# Patient Record
Sex: Female | Born: 1935 | Race: White | Hispanic: No | Marital: Married | Smoking: Never smoker
Health system: Southern US, Community
[De-identification: ages and names within clinical notes are randomized; demographics above are authoritative.]

## PROBLEM LIST (undated history)

## (undated) DIAGNOSIS — I10 Essential (primary) hypertension: Secondary | ICD-10-CM

## (undated) DIAGNOSIS — Z9889 Other specified postprocedural states: Secondary | ICD-10-CM

## (undated) DIAGNOSIS — K219 Gastro-esophageal reflux disease without esophagitis: Secondary | ICD-10-CM

## (undated) DIAGNOSIS — E785 Hyperlipidemia, unspecified: Secondary | ICD-10-CM

## (undated) DIAGNOSIS — E669 Obesity, unspecified: Secondary | ICD-10-CM

## (undated) DIAGNOSIS — Z8719 Personal history of other diseases of the digestive system: Secondary | ICD-10-CM

## (undated) DIAGNOSIS — F419 Anxiety disorder, unspecified: Secondary | ICD-10-CM

## (undated) DIAGNOSIS — Z87442 Personal history of urinary calculi: Secondary | ICD-10-CM

## (undated) DIAGNOSIS — F32A Depression, unspecified: Secondary | ICD-10-CM

## (undated) DIAGNOSIS — F329 Major depressive disorder, single episode, unspecified: Secondary | ICD-10-CM

## (undated) HISTORY — PX: ABDOMINAL HYSTERECTOMY: SHX81

## (undated) HISTORY — DX: Other specified postprocedural states: Z98.890

## (undated) HISTORY — DX: Depression, unspecified: F32.A

## (undated) HISTORY — PX: KIDNEY STONE SURGERY: SHX686

## (undated) HISTORY — DX: Major depressive disorder, single episode, unspecified: F32.9

## (undated) HISTORY — DX: Obesity, unspecified: E66.9

## (undated) HISTORY — DX: Hyperlipidemia, unspecified: E78.5

## (undated) HISTORY — DX: Personal history of urinary calculi: Z87.442

## (undated) HISTORY — DX: Personal history of other diseases of the digestive system: Z87.19

## (undated) HISTORY — DX: Anxiety disorder, unspecified: F41.9

## (undated) HISTORY — DX: Essential (primary) hypertension: I10

## (undated) HISTORY — DX: Gastro-esophageal reflux disease without esophagitis: K21.9

---

## 1998-04-24 ENCOUNTER — Ambulatory Visit: Admission: RE | Admit: 1998-04-24 | Discharge: 1998-04-24 | Payer: Self-pay | Admitting: Internal Medicine

## 2003-04-05 ENCOUNTER — Encounter: Admission: RE | Admit: 2003-04-05 | Discharge: 2003-05-31 | Payer: Self-pay | Admitting: Internal Medicine

## 2004-05-20 ENCOUNTER — Ambulatory Visit: Payer: Self-pay

## 2004-06-21 ENCOUNTER — Ambulatory Visit: Payer: Self-pay | Admitting: Internal Medicine

## 2004-10-15 ENCOUNTER — Ambulatory Visit: Payer: Self-pay | Admitting: Internal Medicine

## 2004-12-19 ENCOUNTER — Ambulatory Visit: Payer: Self-pay | Admitting: Internal Medicine

## 2005-03-31 ENCOUNTER — Ambulatory Visit: Payer: Self-pay | Admitting: Internal Medicine

## 2006-02-26 ENCOUNTER — Ambulatory Visit: Payer: Self-pay | Admitting: Internal Medicine

## 2006-04-17 ENCOUNTER — Ambulatory Visit: Payer: Self-pay | Admitting: Internal Medicine

## 2006-04-17 ENCOUNTER — Inpatient Hospital Stay (HOSPITAL_COMMUNITY): Admission: EM | Admit: 2006-04-17 | Discharge: 2006-04-18 | Payer: Self-pay | Admitting: *Deleted

## 2006-04-18 ENCOUNTER — Ambulatory Visit: Payer: Self-pay | Admitting: Endocrinology

## 2006-04-20 ENCOUNTER — Ambulatory Visit (HOSPITAL_COMMUNITY): Admission: RE | Admit: 2006-04-20 | Discharge: 2006-04-20 | Payer: Self-pay | Admitting: Internal Medicine

## 2006-04-20 ENCOUNTER — Ambulatory Visit: Payer: Self-pay | Admitting: Internal Medicine

## 2006-04-22 ENCOUNTER — Ambulatory Visit: Payer: Self-pay

## 2006-05-05 ENCOUNTER — Ambulatory Visit: Payer: Self-pay | Admitting: Gastroenterology

## 2006-06-18 ENCOUNTER — Ambulatory Visit: Payer: Self-pay | Admitting: Internal Medicine

## 2006-10-01 ENCOUNTER — Ambulatory Visit: Payer: Self-pay | Admitting: Cardiovascular Disease

## 2006-10-22 ENCOUNTER — Ambulatory Visit: Payer: Self-pay

## 2006-10-22 ENCOUNTER — Ambulatory Visit: Payer: Self-pay | Admitting: Cardiology

## 2006-10-22 LAB — CONVERTED CEMR LAB
ALT: 19 units/L (ref 0–40)
Albumin: 3.4 g/dL — ABNORMAL LOW (ref 3.5–5.2)
Alkaline Phosphatase: 113 units/L (ref 39–117)
Bilirubin, Direct: 0.1 mg/dL (ref 0.0–0.3)
Total CHOL/HDL Ratio: 4.1
VLDL: 19 mg/dL (ref 0–40)

## 2006-10-29 ENCOUNTER — Ambulatory Visit: Payer: Self-pay | Admitting: Cardiovascular Disease

## 2007-03-06 ENCOUNTER — Encounter: Payer: Self-pay | Admitting: Internal Medicine

## 2007-03-06 DIAGNOSIS — I10 Essential (primary) hypertension: Secondary | ICD-10-CM

## 2007-03-06 DIAGNOSIS — E785 Hyperlipidemia, unspecified: Secondary | ICD-10-CM

## 2007-03-06 DIAGNOSIS — E669 Obesity, unspecified: Secondary | ICD-10-CM

## 2007-03-06 DIAGNOSIS — F329 Major depressive disorder, single episode, unspecified: Secondary | ICD-10-CM

## 2007-03-06 DIAGNOSIS — J309 Allergic rhinitis, unspecified: Secondary | ICD-10-CM | POA: Insufficient documentation

## 2007-03-06 DIAGNOSIS — F411 Generalized anxiety disorder: Secondary | ICD-10-CM | POA: Insufficient documentation

## 2007-03-06 DIAGNOSIS — Z87442 Personal history of urinary calculi: Secondary | ICD-10-CM

## 2007-03-06 DIAGNOSIS — K219 Gastro-esophageal reflux disease without esophagitis: Secondary | ICD-10-CM

## 2007-03-14 IMAGING — CR DG RIBS W/ CHEST 3+V*L*
4 series · 4 of 4 positions shown · non-contrast
Comparison: none

CLINICAL DATA: 70-year-old female with left rib pain.  Pain with breathing.
 LEFT RIBS WITH PA CHEST:

[w chest pa *]
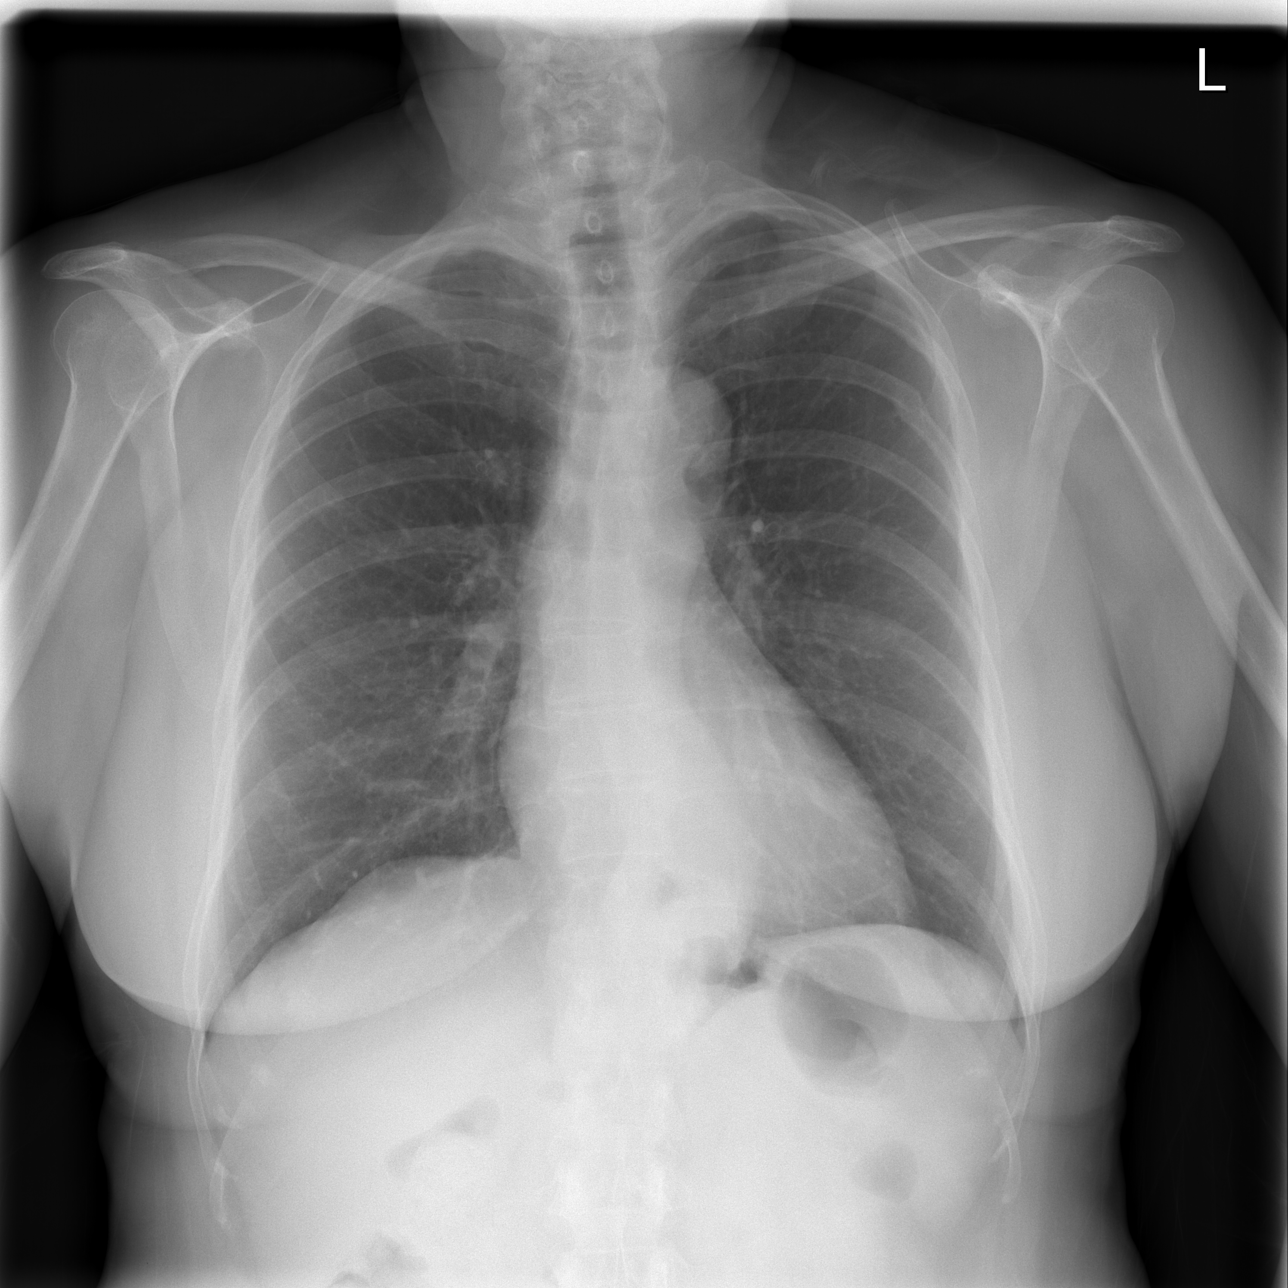

[w ribs ap/pa lower left *]
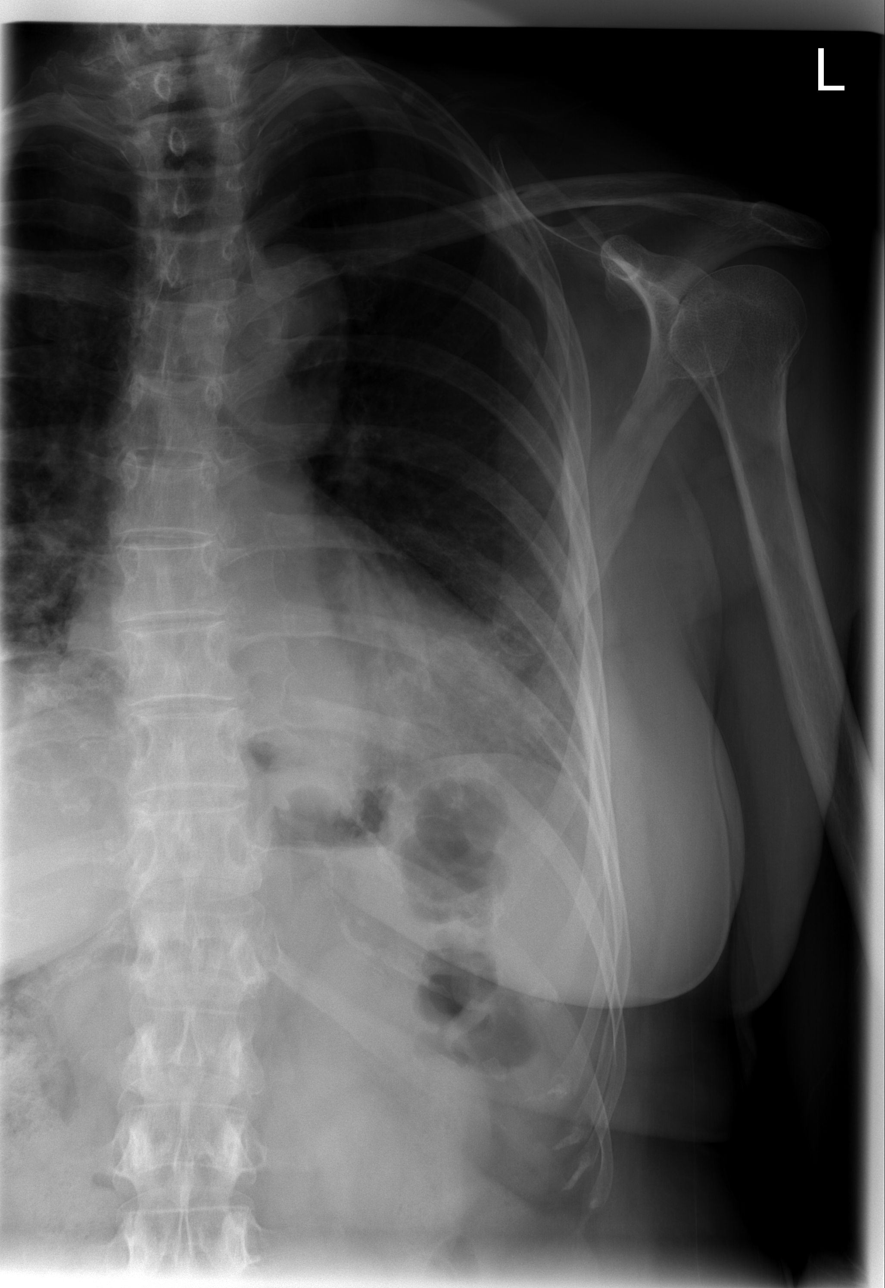

[w ribs oblique left * (1 of 2)]
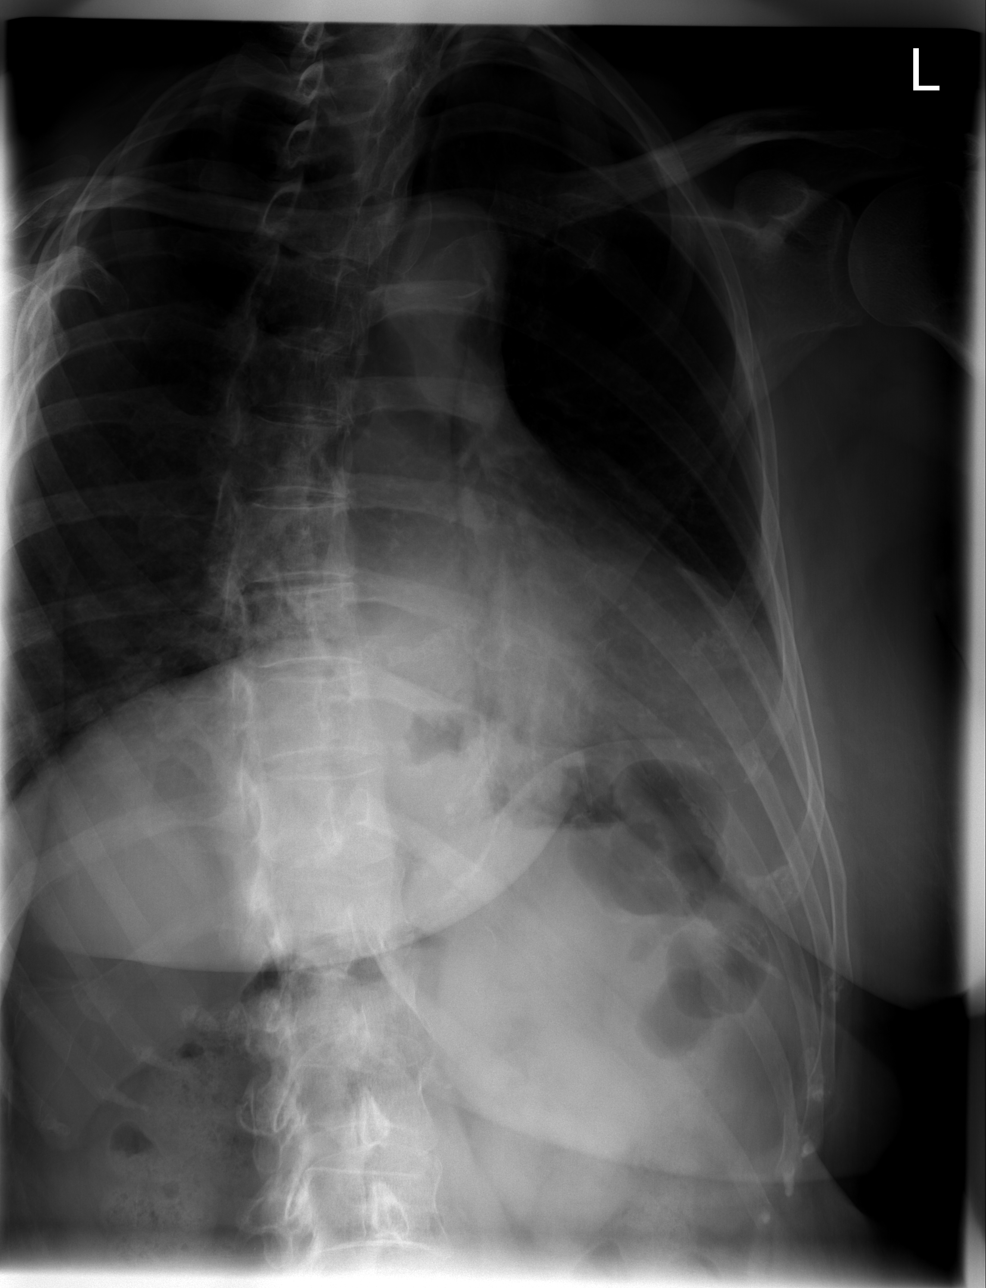

[w ribs oblique left * (2 of 2)]
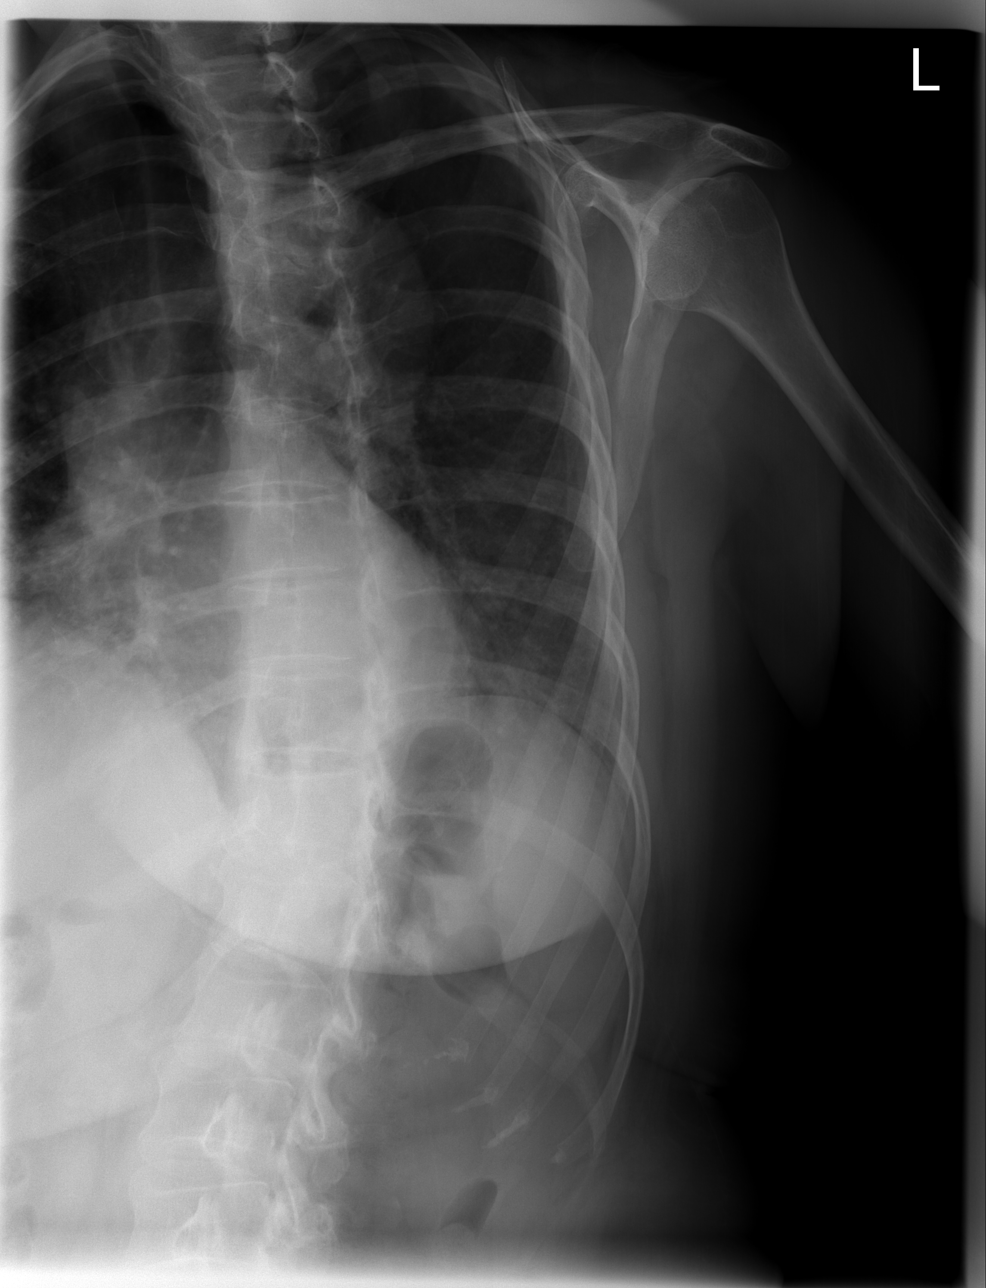

[4 of 4 positions shown; findings below may reference images not displayed]

FINDINGS: There is mild curvature of the thoracolumbar spine.  Heart size is normal.  Interstitial changes of the lungs are likely chronic in nature.  Unchanged from 04/17/06.
 There is improved aeration of the left base.  Remote fractures of the posterior left 4th and 5th ribs are redemonstrated.  No acute fractures are evident.
IMPRESSION: 1.  Remote fractures of left fourth and fifth posterior ribs.
 2.  No acute fracture.
 3.  Chronic interstitial lung disease.

## 2007-03-18 ENCOUNTER — Ambulatory Visit: Payer: Self-pay | Admitting: Internal Medicine

## 2007-03-18 LAB — CONVERTED CEMR LAB
Albumin: 3.3 g/dL — ABNORMAL LOW (ref 3.5–5.2)
Alkaline Phosphatase: 96 units/L (ref 39–117)
BUN: 19 mg/dL (ref 6–23)
Basophils Absolute: 0 10*3/uL (ref 0.0–0.1)
Bilirubin Urine: NEGATIVE
Cholesterol: 215 mg/dL (ref 0–200)
GFR calc Af Amer: 71 mL/min
GFR calc non Af Amer: 58 mL/min
HDL: 51.9 mg/dL (ref >39.0)
Hemoglobin, Urine: NEGATIVE
Hemoglobin: 12.8 g/dL (ref 12.0–15.0)
Leukocytes, UA: NEGATIVE
Lymphocytes Relative: 24.9 % (ref 12.0–46.0)
MCHC: 34.8 g/dL (ref 30.0–36.0)
Monocytes Absolute: 0.7 10*3/uL (ref 0.2–0.7)
Monocytes Relative: 11.6 % — ABNORMAL HIGH (ref 3.0–11.0)
Neutro Abs: 3.8 10*3/uL (ref 1.4–7.7)
Potassium: 4.7 meq/L (ref 3.5–5.1)
RDW: 11.9 % (ref 11.5–14.6)
TSH: 2.61 microintl units/mL (ref 0.35–5.50)
Total CHOL/HDL Ratio: 4.1
Triglycerides: 119 mg/dL (ref 0–149)
VLDL: 24 mg/dL (ref 0–40)
pH: 6 (ref 5.0–8.0)

## 2007-04-09 ENCOUNTER — Ambulatory Visit: Payer: Self-pay

## 2007-05-03 ENCOUNTER — Ambulatory Visit: Payer: Self-pay | Admitting: Cardiovascular Disease

## 2007-06-30 ENCOUNTER — Encounter: Payer: Self-pay | Admitting: Internal Medicine

## 2007-08-02 ENCOUNTER — Telehealth: Payer: Self-pay | Admitting: Internal Medicine

## 2007-10-28 ENCOUNTER — Ambulatory Visit: Payer: Self-pay | Admitting: Gastroenterology

## 2007-10-28 DIAGNOSIS — R1310 Dysphagia, unspecified: Secondary | ICD-10-CM | POA: Insufficient documentation

## 2007-12-02 ENCOUNTER — Ambulatory Visit: Payer: Self-pay | Admitting: Gastroenterology

## 2007-12-02 ENCOUNTER — Encounter: Payer: Self-pay | Admitting: Gastroenterology

## 2007-12-06 ENCOUNTER — Encounter: Payer: Self-pay | Admitting: Gastroenterology

## 2008-02-17 ENCOUNTER — Ambulatory Visit: Payer: Self-pay | Admitting: Cardiovascular Disease

## 2008-02-17 LAB — CONVERTED CEMR LAB
ALT: 17 units/L (ref 0–35)
AST: 26 units/L (ref 0–37)
Albumin: 3.4 g/dL — ABNORMAL LOW (ref 3.5–5.2)
Alkaline Phosphatase: 83 units/L (ref 39–117)
BUN: 17 mg/dL (ref 6–23)
Bilirubin, Direct: 0.1 mg/dL (ref 0.0–0.3)
CO2: 30 meq/L (ref 19–32)
Calcium: 9.2 mg/dL (ref 8.4–10.5)
Chloride: 100 meq/L (ref 96–112)
Cholesterol: 202 mg/dL (ref 0–200)
Creatinine, Ser: 0.9 mg/dL (ref 0.4–1.2)
Direct LDL: 98.3 mg/dL
GFR calc Af Amer: 80 mL/min
GFR calc non Af Amer: 66 mL/min
Glucose, Bld: 80 mg/dL (ref 70–99)
HDL: 53.2 mg/dL (ref >39.0)
Potassium: 4.2 meq/L (ref 3.5–5.1)
Sodium: 138 meq/L (ref 135–145)
Total Bilirubin: 0.9 mg/dL (ref 0.3–1.2)
Total CHOL/HDL Ratio: 3.8
Total Protein: 6.7 g/dL (ref 6.0–8.3)
Triglycerides: 73 mg/dL (ref 0–149)
VLDL: 15 mg/dL (ref 0–40)

## 2008-03-01 ENCOUNTER — Ambulatory Visit: Payer: Self-pay | Admitting: Internal Medicine

## 2008-04-18 ENCOUNTER — Encounter: Payer: Self-pay | Admitting: Internal Medicine

## 2008-10-24 ENCOUNTER — Ambulatory Visit: Payer: Self-pay

## 2008-10-24 ENCOUNTER — Encounter: Payer: Self-pay | Admitting: Cardiovascular Disease

## 2008-10-31 ENCOUNTER — Telehealth: Payer: Self-pay | Admitting: Cardiovascular Disease

## 2009-06-01 ENCOUNTER — Ambulatory Visit: Payer: Self-pay | Admitting: Cardiovascular Disease

## 2009-06-20 ENCOUNTER — Ambulatory Visit: Payer: Self-pay | Admitting: Internal Medicine

## 2009-06-20 DIAGNOSIS — R5383 Other fatigue: Secondary | ICD-10-CM

## 2009-06-20 DIAGNOSIS — R5381 Other malaise: Secondary | ICD-10-CM | POA: Insufficient documentation

## 2009-06-20 DIAGNOSIS — R05 Cough: Secondary | ICD-10-CM

## 2009-06-21 LAB — CONVERTED CEMR LAB
Basophils Relative: 0.5 % (ref 0.0–3.0)
Chloride: 100 meq/L (ref 96–112)
Creatinine, Ser: 0.8 mg/dL (ref 0.4–1.2)
Eosinophils Relative: 1.4 % (ref 0.0–5.0)
Folate: 6.6 ng/mL
GFR calc non Af Amer: 75.07 mL/min (ref >60)
MCV: 101.6 fL — ABNORMAL HIGH (ref 78.0–100.0)
Monocytes Absolute: 0.8 10*3/uL (ref 0.1–1.0)
Monocytes Relative: 11.9 % (ref 3.0–12.0)
Neutrophils Relative %: 58 % (ref 43.0–77.0)
Potassium: 4.1 meq/L (ref 3.5–5.1)
RBC: 3.92 M/uL (ref 3.87–5.11)
Vitamin B-12: 560 pg/mL (ref 211–911)
WBC: 6.9 10*3/uL (ref 4.5–10.5)

## 2009-07-18 ENCOUNTER — Ambulatory Visit: Payer: Self-pay | Admitting: Cardiovascular Disease

## 2009-07-23 LAB — CONVERTED CEMR LAB
ALT: 14 units/L (ref 0–35)
AST: 21 units/L (ref 0–37)
Albumin: 3.5 g/dL (ref 3.5–5.2)
Cholesterol: 199 mg/dL (ref 0–200)
HDL: 58.6 mg/dL (ref >39.00)
Total Protein: 6.6 g/dL (ref 6.0–8.3)
Triglycerides: 91 mg/dL (ref 0.0–149.0)
VLDL: 18.2 mg/dL (ref 0.0–40.0)

## 2009-08-31 ENCOUNTER — Encounter: Payer: Self-pay | Admitting: Cardiovascular Disease

## 2009-09-03 ENCOUNTER — Encounter (INDEPENDENT_AMBULATORY_CARE_PROVIDER_SITE_OTHER): Payer: Self-pay | Admitting: *Deleted

## 2009-09-17 ENCOUNTER — Telehealth: Payer: Self-pay | Admitting: Internal Medicine

## 2009-09-19 ENCOUNTER — Ambulatory Visit: Payer: Self-pay | Admitting: Internal Medicine

## 2009-10-04 ENCOUNTER — Telehealth: Payer: Self-pay | Admitting: Internal Medicine

## 2009-10-11 ENCOUNTER — Encounter: Payer: Self-pay | Admitting: Internal Medicine

## 2009-10-25 ENCOUNTER — Ambulatory Visit: Payer: Self-pay | Admitting: Internal Medicine

## 2009-10-25 DIAGNOSIS — N951 Menopausal and female climacteric states: Secondary | ICD-10-CM | POA: Insufficient documentation

## 2009-10-30 ENCOUNTER — Telehealth: Payer: Self-pay | Admitting: Internal Medicine

## 2009-12-13 ENCOUNTER — Encounter: Payer: Self-pay | Admitting: Internal Medicine

## 2010-06-11 ENCOUNTER — Ambulatory Visit: Payer: Self-pay | Admitting: Cardiovascular Disease

## 2010-06-11 ENCOUNTER — Encounter: Payer: Self-pay | Admitting: Cardiovascular Disease

## 2010-06-11 DIAGNOSIS — R0609 Other forms of dyspnea: Secondary | ICD-10-CM | POA: Insufficient documentation

## 2010-06-11 DIAGNOSIS — R0989 Other specified symptoms and signs involving the circulatory and respiratory systems: Secondary | ICD-10-CM

## 2010-07-11 ENCOUNTER — Other Ambulatory Visit: Payer: Self-pay | Admitting: Cardiovascular Disease

## 2010-07-11 ENCOUNTER — Ambulatory Visit (HOSPITAL_COMMUNITY): Admission: RE | Admit: 2010-07-11 | Payer: Self-pay | Source: Home / Self Care | Admitting: Cardiovascular Disease

## 2010-07-11 ENCOUNTER — Ambulatory Visit: Admission: RE | Admit: 2010-07-11 | Payer: Self-pay | Source: Home / Self Care

## 2010-07-11 ENCOUNTER — Ambulatory Visit
Admission: RE | Admit: 2010-07-11 | Discharge: 2010-07-11 | Payer: Self-pay | Source: Home / Self Care | Attending: Cardiovascular Disease | Admitting: Cardiovascular Disease

## 2010-07-11 LAB — BASIC METABOLIC PANEL
BUN: 18 mg/dL (ref 6–23)
CO2: 33 mEq/L — ABNORMAL HIGH (ref 19–32)
Calcium: 9.2 mg/dL (ref 8.4–10.5)
Chloride: 100 mEq/L (ref 96–112)
Creatinine, Ser: 0.7 mg/dL (ref 0.4–1.2)
GFR: 87.32 mL/min (ref >60.00)
Glucose, Bld: 87 mg/dL (ref 70–99)
Potassium: 4.1 mEq/L (ref 3.5–5.1)
Sodium: 138 mEq/L (ref 135–145)

## 2010-07-11 LAB — HIGH SENSITIVITY CRP: CRP, High Sensitivity: 6.63 mg/L — ABNORMAL HIGH (ref 0.00–5.00)

## 2010-07-11 LAB — HEPATIC FUNCTION PANEL
ALT: 13 U/L (ref 0–35)
AST: 18 U/L (ref 0–37)
Albumin: 3.3 g/dL — ABNORMAL LOW (ref 3.5–5.2)
Alkaline Phosphatase: 98 U/L (ref 39–117)
Bilirubin, Direct: 0.2 mg/dL (ref 0.0–0.3)
Total Bilirubin: 1 mg/dL (ref 0.3–1.2)
Total Protein: 6.2 g/dL (ref 6.0–8.3)

## 2010-07-11 LAB — LIPID PANEL
Cholesterol: 215 mg/dL — ABNORMAL HIGH (ref 0–200)
HDL: 58.7 mg/dL (ref >39.00)
Total CHOL/HDL Ratio: 4
Triglycerides: 130 mg/dL (ref 0.0–149.0)
VLDL: 26 mg/dL (ref 0.0–40.0)

## 2010-07-11 LAB — LDL CHOLESTEROL, DIRECT: Direct LDL: 149.1 mg/dL

## 2010-07-16 ENCOUNTER — Telehealth: Payer: Self-pay | Admitting: Cardiovascular Disease

## 2010-07-30 NOTE — Assessment & Plan Note (Signed)
Summary: BLOOD BLISTER IN THROAT PER DENTIST/  SWOLLEN VEINS / NWS   Vital Signs:  Patient profile:   75 year old female Height:      63 inches (160.02 cm) Weight:      151 pounds (68.64 kg) BMI:     26.85 O2 Sat:      96 % on Room air Temp:     97.3 degrees F (36.28 degrees C) oral Pulse rate:   70 / minute Pulse rhythm:   regular BP sitting:   134 / 84  (left arm) Cuff size:   regular  Vitals Entered By: Brenton Grills (October 25, 2009 1:58 PM)  O2 Flow:  Room air CC: pt c/o blood blister in throat per dentisit/pt also wants labwork to check vitamin d, cholesterol, pt also interested in getting zostavax, and discuss stretching esophagus and weight/aj   Primary Care Provider:  Oliver Barre  CC:  pt c/o blood blister in throat per dentisit/pt also wants labwork to check vitamin d, cholesterol, pt also interested in getting zostavax, and and discuss stretching esophagus and weight/aj.  History of Present Illness: Patient of Dr. Raphael Gibney who presents for several reasons: she would like to have ZostaVax; Wanted to disucss hormone replacement treatment. She has severe climacteric symptoms. She wants to discuss Vitamin D testing/replacement and bone health.  Current Medications (verified): 1)  Fluticasone Propionate 50 Mcg/act Susp (Fluticasone Propionate) .... Spray 2 Spray Into Both  Nostrils Once A Day 2)  Premarin 0.625 Mg  Tabs (Estrogens Conjugated) .... Take 1 Tablet By Mouth Once A Day On Hold 3)  Lisinopril-Hydrochlorothiazide 20-25 Mg Tabs (Lisinopril-Hydrochlorothiazide) .... Take 1 Tablet By Mouth Once A Day 4)  Aspirin 81 Mg Tbec (Aspirin) .... Take One Tablet By Mouth Daily 5)  Coq10 100 Mg Caps (Coenzyme Q10) .... Sometimes 6)  Protonix 40 Mg  Tbec (Pantoprazole Sodium) .Marland Kitchen.. 1 By Mouth Once Daily X Next 7 Days, Then Once Daily As Needed 7)  Tessalon Perles 100 Mg Caps (Benzonatate) .Marland Kitchen.. 1 By Mouth Three Times A Day As Needed For Cough 8)  Simvastatin 40 Mg Tabs (Simvastatin)  .... Take 1/2 Tablet Daily 9)  Flonase 50 Mcg/act Susp (Fluticasone Propionate) .... 2 Sprays Each Nostril Once Daily  Allergies (verified): 1)  ! Sulfa 2)  ! Penicillin  Past History:  Past Medical History: Last updated: January 14, 2009 Allergic rhinitis Anxiety Depression GERD Hyperlipidemia Hypertension Obesity H/O Renal Stones  Past Surgical History: Last updated: 01/14/09 Hysterectomy Removal of Kidney Stones- 1985  Family History: Last updated: 2009/01/14 Father: Died at age 19 heart attack Mother: Died at age 41 cerebral hemmorage  Social History: Last updated: 01-14-09 Married  3 children  Risk Factors: Smoking Status: never (03/06/2007)  Review of Systems  The patient denies anorexia, fever, weight loss, decreased hearing, chest pain, dyspnea on exertion, prolonged cough, abdominal pain, incontinence, and difficulty walking.    Physical Exam  General:  WNWD white female in no distress Head:  normocephalic and atraumatic.   Lungs:  normal respiratory effort.   Heart:  normal rate and regular rhythm.   Neurologic:  alert & oriented X3, cranial nerves II-XII intact, and gait normal.   Skin:  turgor normal and color normal.   Psych:  Oriented X3, normally interactive, good eye contact, and not anxious appearing.     Impression & Recommendations:  Problem # 1:  SYMPTOMATIC MENOPAUSAL/FEMALE CLIMACTERIC STATES (ICD-627.2) Discussed the symptoms of menopause. She is s/p hyterectomy at an early age but she  has noticed typical symptoms. We reviewed the risks and benefits of hormone replacement including the increased risk of breast cancer, the small potential risk for increased stroke and the lack of protection offorded by HRT in regard to heart disease. The benefits were reviewed including reducing the physical symptoms.  Plan - with a full understanding of the risks involved she wishes to start low dose estrogen replacement, therefore Rx provided for  premarin 0.3 mg once daily.  (at least 50% of 35 minute visit on education/counselling on this issue)  Her updated medication list for this problem includes:    Premarin 0.3 Mg Tabs (Estrogens conjugated) .Marland Kitchen... 1 by mouth once daily  Problem # 2:  Preventive Health Care (ICD-V70.0) She is concerned about vitamin D deficiency and bone health. 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 she does have a diet that includes calcium containing foods. We discussed the normal level of Vitamin D recommended as well as discussing the proven and yet to be proven benefits of Vitamin. D. She has never had a bone density study.   Plan - DXA scan - if osteopenic or worse will start routine treatment. For failure to respond will evaulate Vit D levels.(at least 40% of a 35 minute visit on education/counselling on this issue)  Immunizations: discussed shisngles and prevention. she is a candidate for Zostavax.   Complete Medication List: 1)  Fluticasone Propionate 50 Mcg/act Susp (Fluticasone propionate) .... Spray 2 spray into both  nostrils once a day 2)  Premarin 0.3 Mg Tabs (Estrogens conjugated) .Marland Kitchen.. 1 by mouth once daily 3)  Lisinopril-hydrochlorothiazide 20-25 Mg Tabs (Lisinopril-hydrochlorothiazide) .... Take 1 tablet by mouth once a day 4)  Aspirin 81 Mg Tbec (Aspirin) .... Take one tablet by mouth daily 5)  Coq10 100 Mg Caps (Coenzyme q10) .... Sometimes 6)  Protonix 40 Mg Tbec (Pantoprazole sodium) .Marland Kitchen.. 1 by mouth once daily x next 7 days, then once daily as needed 7)  Tessalon Perles 100 Mg Caps (Benzonatate) .Marland Kitchen.. 1 by mouth three times a day as needed for cough 8)  Simvastatin 40 Mg Tabs (Simvastatin) .... Take 1/2 tablet daily 9)  Flonase 50 Mcg/act Susp (Fluticasone propionate) .... 2 sprays each nostril once daily  Other Orders: Zoster (Shingles) Vaccine Live [id number]) Admin 1st Vaccine (29562) Prescriptions: PREMARIN 0.3 MG TABS (ESTROGENS CONJUGATED) 1 by mouth  once daily  #30 x 12   Entered and Authorized by:   Jacques Navy MD   Signed by:   Jacques Navy MD on 10/25/2009   Method used:   Print then Give to Patient   RxID:   [id number]    Immunizations Administered:  Zostavax # 1:    Vaccine Type: Zostavax    Site: left arm    Mfr: Merck    Dose: 0.5 ml    Route: [place]    Given by: Ami Bullins CMA    Exp. Date: 10/29/2010    Lot #: [id number]    VIS given: 04/11/05 given October 25, 2009.

## 2010-07-30 NOTE — Medication Information (Signed)
Summary: Premarin  Premarin   Imported By: Marylou Mccoy 12/05/2009 14:40:14  _____________________________________________________________________  External Attachment:    Type:   Image     Comment:   External Document

## 2010-07-30 NOTE — Progress Notes (Signed)
Summary: flonase  Phone Note Call from Patient Call back at [facility] Phone [phone]   Summary of Call: Patient called to see if she could switch back to flonase.  Initial call taken by: Lucious Groves,  October 04, 2009 4:22 PM  Follow-up for Phone Call        Patient is aware of prescription and is aware that she needs an appt. Due to fixed income patient must call back later for appt. Follow-up by: Lucious Groves,  October 04, 2009 4:37 PM    New/Updated Medications: FLONASE 50 MCG/ACT SUSP (FLUTICASONE PROPIONATE) 2 sprays each nostril once daily Prescriptions: FLONASE 50 MCG/ACT SUSP (FLUTICASONE PROPIONATE) 2 sprays each nostril once daily  #1 month x 3   Entered by:   Lucious Groves   Authorized by:   Jacques Navy MD   Signed by:   Lucious Groves on 10/04/2009   Method used:   Electronically to        [facility]* (retail)       [address]       [place], [place]  [address]       Ph: [phone]       Fax: [phone]   RxID:   [id number] FLONASE 50 MCG/ACT SUSP (FLUTICASONE PROPIONATE) 2 sprays each nostril once daily  #1 month x 3   Entered by:   Lucious Groves   Authorized by:   Jacques Navy MD   Signed by:   Lucious Groves on 10/04/2009   Method used:   Electronically to        [facility] [phone]* (retail)       [address] [place], [place]  [address]       Ph: [phone]       Fax: [phone]   RxID:   [id number]

## 2010-07-30 NOTE — Miscellaneous (Signed)
Summary: update med  Clinical Lists Changes  Medications: Added new medication of SIMVASTATIN 40 MG TABS (SIMVASTATIN) take 1/2 tablet daily

## 2010-07-30 NOTE — Progress Notes (Signed)
Summary: SWITCH PCP  Phone Note Call from Patient Call back at Home Phone [phone]   Summary of Call: Kellyann Pepitone WANTS TO SWITCH PCP FROM DR. JOHN TO DR. Debby Bud.  WILL THIS BE OK? Initial call taken by: Hilarie Fredrickson,  September 17, 2009 10:25 AM  Follow-up for Phone Call        OK Follow-up by: Jacques Navy MD,  September 17, 2009 12:59 PM  Additional Follow-up for Phone Call Additional follow up Details #1::        ok Additional Follow-up by: Corwin Levins MD,  September 17, 2009 1:07 PM    Additional Follow-up for Phone Call Additional follow up Details #2::    PT IS AWARE/  APPT WITH DR. Debby Bud MARCH 23. Follow-up by: Hilarie Fredrickson,  September 17, 2009 3:01 PM

## 2010-07-30 NOTE — Progress Notes (Signed)
Summary: Rash  Phone Note Call from Patient   Caller: pt  Summary of Call: Pt called and states she was doing yard work on sat, She questions if she got in to some poison oak. She states the only symptoms she has is itching eyes. No rash or break out. she wants to know what you advise for itching eyes. Initial call taken by: Ami Bullins CMA,  Oct 30, 2009 9:47 AM  Follow-up for Phone Call        1. moisturizing eye drop of choice 2. otc loratadine 10mg  once daily  Follow-up by: Jacques Navy MD,  Oct 30, 2009 12:01 PM  Additional Follow-up for Phone Call Additional follow up Details #1::        I spoke w/pt Tuesday pm Additional Follow-up by: Lamar Sprinkles, CMA,  Nov 01, 2009 8:03 AM

## 2010-07-30 NOTE — Letter (Signed)
Summary: [facility]  [facility]   Imported By: Lester Dimock 12/14/2009 08:16:56  _____________________________________________________________________  External Attachment:    Type:   Image     Comment:   External Document

## 2010-07-30 NOTE — Letter (Signed)
Summary: [facility]  [facility]   Imported By: Sherian Rein 12/26/2009 11:47:05  _____________________________________________________________________  External Attachment:    Type:   Image     Comment:   External Document

## 2010-08-01 NOTE — Progress Notes (Signed)
Summary: lab results  Phone Note Call from Patient Call back at Home Phone [phone]   Caller: Patient Reason for Call: Talk to Nurse, Talk to Doctor, Lab or Test Results Summary of Call: pt rtn call to get lab results Initial call taken by: Omer Jack,  July 16, 2010 9:25 AM  Follow-up for Phone Call        I spoke with the pt and made her aware of lab results. The pt said she cannot take STATINS and she will work on diet, exercise and weight loss.  The pt also said she stopped the Amlodipine because it did not improve her BP.  The pt went back to Lisinopril HCT.  Follow-up by: Julieta Gutting, RN, BSN,  July 16, 2010 6:20 PM    New/Updated Medications: LISINOPRIL-HYDROCHLOROTHIAZIDE 20-25 MG TABS (LISINOPRIL-HYDROCHLOROTHIAZIDE) take one tablet daily

## 2010-08-01 NOTE — Assessment & Plan Note (Signed)
Summary: yearly   Visit Type:  1 year follow up Referring Provider:  Sob Primary Provider:  Oliver Barre  CC:  Indigestion- Weight gain-Sob.  History of Present Illness: This is a 75 year old woman with hypertension and hyperlipidemia presenting today for followup evaluation. She complains of exertional dyspnea and edema. No chest pain with activity. She has not been exercising regularly. Reports compliance with her medications.  Current Medications (verified): 1)  Fluticasone Propionate 50 Mcg/act Susp (Fluticasone Propionate) .... Spray 2 Spray Into Both  Nostrils Once A Day 2)  Premarin 0.3 Mg Tabs (Estrogens Conjugated) .Marland Kitchen.. 1 By Mouth Once Daily 3)  Lisinopril-Hydrochlorothiazide 20-25 Mg Tabs (Lisinopril-Hydrochlorothiazide) .... Take 1 Tablet By Mouth Once A Day 4)  Aspirin 81 Mg Tbec (Aspirin) .... Take One Tablet By Mouth Daily 5)  Coq10 100 Mg Caps (Coenzyme Q10) .... Sometimes 6)  Protonix 40 Mg  Tbec (Pantoprazole Sodium) .Marland Kitchen.. 1 By Mouth Once Daily X Next 7 Days, Then Once Daily As Needed 7)  Simvastatin 40 Mg Tabs (Simvastatin) .... Take 1/2 Tablet Daily. On Hold Last Dose About 6 Months Ago.  Allergies: 1)  ! Sulfa 2)  ! Penicillin 3)  ! * Statints  Past History:  Past medical history reviewed for relevance to current acute and chronic problems.  Past Medical History: Reviewed history from 01/13/2009 and no changes required. Allergic rhinitis Anxiety Depression GERD Hyperlipidemia Hypertension Obesity H/O Renal Stones  Review of Systems       Negative except as per HPI   Vital Signs:  Patient profile:   75 year old female Height:      63 inches Weight:      153.25 pounds BMI:     27.25 O2 Sat:      97 % on Room air Pulse rate:   99 / minute Pulse rhythm:   regular Resp:     18 per minute BP sitting:   146 / 90  (left arm) Cuff size:   large  Vitals Entered By: Vikki Ports (June 11, 2010 2:28 PM)  O2 Flow:  Room air  Physical  Exam  General:  Pt is alert and oriented, in no acute distress. HEENT: normal Neck: normal carotid upstrokes without bruits, JVP normal Lungs: CTA CV: RRR with 2/6 systolic murmur RUSB Abd: soft, NT, positive BS, no bruit, no organomegaly Ext: no clubbing, cyanosis, or edema. peripheral pulses 2+ and equal Skin: warm and dry without rash    EKG  Procedure date:  06/11/2010  Findings:      NSR 99 bpm, nonspecific ST-T abnormality.  Impression & Recommendations:  Problem # 1:  HYPERTENSION (ICD-401.9) BP control suboptimal. Pt would like to discontinue lisinopril. Will try amlodipine and HCTZ. Discussed need for diet, exercise, and weight loss.  Will check stress echo in setting exertional dyspnea.  Her updated medication list for this problem includes:    Amlodipine Besylate 5 Mg Tabs (Amlodipine besylate) .Marland Kitchen... Take one tablet by mouth daily    Aspirin 81 Mg Tbec (Aspirin) .Marland Kitchen... Take one tablet by mouth daily    Hydrochlorothiazide 25 Mg Tabs (Hydrochlorothiazide) .Marland Kitchen... Take one tablet by mouth daily.  BP today: 146/90 Prior BP: 134/84 (10/25/2009)  Labs Reviewed: K+: 4.1 (06/20/2009) Creat: : 0.8 (06/20/2009)   Chol: 199 (07/18/2009)   HDL: 58.60 (07/18/2009)   LDL: 122 (07/18/2009)   TG: 91.0 (07/18/2009)  Problem # 2:  HYPERLIPIDEMIA (ICD-272.4) Recommend recheck of lipids as pt has been off of statin for several months now. Will  review results and decide on whether to resume statin Rx for primary prevention.  Her updated medication list for this problem includes:    Simvastatin 40 Mg Tabs (Simvastatin) .Marland Kitchen... Take 1/2 tablet daily. on hold last dose about 6 months ago.  CHOL: 199 (07/18/2009)   LDL: 122 (07/18/2009)   HDL: 58.60 (07/18/2009)   TG: 91.0 (07/18/2009) CRP: 7.00 mg/L (10/22/2006)     Her updated medication list for this problem includes:    Simvastatin 40 Mg Tabs (Simvastatin) .Marland Kitchen... Take 1/2 tablet daily. on hold last dose about 6 months ago.  Other  Orders: EKG w/ Interpretation (93000) Echocardiogram (Echo)  Patient Instructions: 1)  Your physician recommends that you schedule a follow-up appointment in: 12 months 2)  Your physician recommends that you return for a FASTING lipid profile,liver profile, bmp and crp(c-reactive protein)-401.1,272.0  on day of echocardiogram in January 3)  Your physician has recommended you make the following change in your medication: Stop lisinopril/hctz combination.  Start amlodipine 5mg  by mouth daily and hydrochlorothiazide 25 mg by mouth daily 4)  Your physician has requested that you have an echocardiogram.  Echocardiography is a painless test that uses sound waves to create images of your heart. It provides your doctor with information about the size and shape of your heart and how well your heart's chambers and valves are working.  This procedure takes approximately one hour. There are no restrictions for this procedure. To be done in January Prescriptions: HYDROCHLOROTHIAZIDE 25 MG TABS (HYDROCHLOROTHIAZIDE) Take one tablet by mouth daily.  #30 x 11   Entered by:   Dossie Arbour, RN, BSN   Authorized by:   Norva Karvonen, MD   Signed by:   Dossie Arbour, RN, BSN on 06/11/2010   Method used:   Electronically to        [facility] (retail)       [address]       [place], [place]  [address]       Ph: [phone]       Fax: [phone]   RxID:   [id number] AMLODIPINE BESYLATE 5 MG TABS (AMLODIPINE BESYLATE) Take one tablet by mouth daily  #30 x 11   Entered by:   Dossie Arbour, RN, BSN   Authorized by:   Norva Karvonen, MD   Signed by:   Dossie Arbour, RN, BSN on 06/11/2010   Method used:   Electronically to        [facility] (retail)       [address]       [place], [place]  [address]       Ph: [phone]       Fax: [phone]   RxID:   [id number]

## 2010-08-05 ENCOUNTER — Encounter (INDEPENDENT_AMBULATORY_CARE_PROVIDER_SITE_OTHER): Payer: Self-pay | Admitting: *Deleted

## 2010-08-05 ENCOUNTER — Ambulatory Visit (INDEPENDENT_AMBULATORY_CARE_PROVIDER_SITE_OTHER): Payer: MEDICARE | Admitting: Internal Medicine

## 2010-08-05 ENCOUNTER — Other Ambulatory Visit: Payer: MEDICARE

## 2010-08-05 ENCOUNTER — Other Ambulatory Visit: Payer: Self-pay | Admitting: Internal Medicine

## 2010-08-05 ENCOUNTER — Encounter: Payer: Self-pay | Admitting: Internal Medicine

## 2010-08-05 DIAGNOSIS — I10 Essential (primary) hypertension: Secondary | ICD-10-CM

## 2010-08-05 DIAGNOSIS — Z87442 Personal history of urinary calculi: Secondary | ICD-10-CM

## 2010-08-05 DIAGNOSIS — E785 Hyperlipidemia, unspecified: Secondary | ICD-10-CM

## 2010-08-05 DIAGNOSIS — J189 Pneumonia, unspecified organism: Secondary | ICD-10-CM

## 2010-08-05 LAB — URINALYSIS, ROUTINE W REFLEX MICROSCOPIC
Hgb urine dipstick: NEGATIVE
Nitrite: NEGATIVE
Specific Gravity, Urine: <=1.005 (ref 1.000–1.030)
Total Protein, Urine: NEGATIVE
Urine Glucose: NEGATIVE
Urobilinogen, UA: 1 (ref 0.0–1.0)

## 2010-08-05 LAB — CBC WITH DIFFERENTIAL/PLATELET
Basophils Relative: 0.6 % (ref 0.0–3.0)
Eosinophils Relative: 2.7 % (ref 0.0–5.0)
Lymphocytes Relative: 32.8 % (ref 12.0–46.0)
Neutrophils Relative %: 45.5 % (ref 43.0–77.0)
Platelets: 276 10*3/uL (ref 150.0–400.0)
RBC: 4.01 Mil/uL (ref 3.87–5.11)
WBC: 5.9 10*3/uL (ref 4.5–10.5)

## 2010-08-15 NOTE — Assessment & Plan Note (Signed)
Summary: ?pneumonia   Vital Signs:  Patient profile:   75 year old female Height:      63 inches Weight:      151 pounds BMI:     26.85 O2 Sat:      97 % on Room air Temp:     97.7 degrees F oral Pulse rate:   82 / minute BP sitting:   118 / 78  (left arm) Cuff size:   regular  Vitals Entered By: Bill Salinas CMA (August 05, 2010 1:19 PM)  O2 Flow:  Room air CC: pt here to follow up with md since being diagnosed with pneumonia at [facility] primary care  last wens/ ab   Primary Care Provider:  Oliver Barre  CC:  pt here to follow up with md since being diagnosed with pneumonia at [facility] primary care  last wens/ ab.  History of Present Illness: patient had a productive cough and felt weak and ill. She went to [facility] She did have a chest x-ray revealing a small LUL infiltrate. She was started cefdinir 300mg  two times a day and a codeine containing cough syrup.  She still has a congested cough, deep breath causes her to cough. She ws having low grade fevers. She has anorexia. She did not have blood drawn for lab work. She does report that she is constipated.   Current Medications (verified): 1)  Fluticasone Propionate 50 Mcg/act Susp (Fluticasone Propionate) .... Spray 2 Spray Into Both  Nostrils Once A Day 2)  Premarin 0.3 Mg Tabs (Estrogens Conjugated) .Marland Kitchen.. 1 By Mouth Once Daily 3)  Lisinopril-Hydrochlorothiazide 20-25 Mg Tabs (Lisinopril-Hydrochlorothiazide) .... Take One Tablet Daily 4)  Aspirin 81 Mg Tbec (Aspirin) .... Take One Tablet By Mouth Daily 5)  Coq10 100 Mg Caps (Coenzyme Q10) .... Sometimes 6)  Protonix 40 Mg  Tbec (Pantoprazole Sodium) .Marland Kitchen.. 1 By Mouth Once Daily X Next 7 Days, Then Once Daily As Needed 7)  Cefdinir 300 Mg Caps (Cefdinir) .Marland Kitchen.. 1 Capsule Two Times A Day  Allergies (verified): 1)  ! Sulfa 2)  ! Penicillin 3)  ! 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   Physical Exam  General:  alert, well-developed, well-nourished, and well-hydrated.   Head:  normocephalic and atraumatic.   Eyes:  pupils equal, pupils round, and corneas and lenses clear.   Ears:  no external deformities.   Nose:  no external deformity and no external erythema.   Neck:  supple and full ROM.   Lungs:  normal respiratory effort, normal breath sounds, no crackles, and no wheezes.   Heart:  normal rate and regular rhythm.   Abdomen:  soft.     Impression & Recommendations:  Problem # 1:  PNEUMONIA, LEFT UPPER LOBE (ICD-486) follow-up on what appears to be a mild infection based on history and small infiltrate on x-ray with both film and report reviewed.  Plan -complete antibiotics  lab - f/u [facility]          cough syrup of choice with guafenesin or take mucinex          no restirictions on activity.  Her updated medication list for this problem includes:    Cefdinir 300 Mg Caps (Cefdinir) .Marland Kitchen... 1 capsule two times a day  Orders: TLB-CBC Platelet - w/Differential (85025-CBCD)  Problem # 2:  RENAL CALCULUS, HX OF (ICD-V13.01) Patient reports dark urine  Plan - U/a r/o hematuria  Orders: TLB-Udip w/ Micro (81001-URINE)  Problem # 3:  HYPERLIPIDEMIA (ICD-272.4) eviewed last Lab from Jan 12th  - HDL 58, LDL 146. Patient is intolerant of statins  Plan - not at treatment threshold of 160 for patient w/o known caridac disease, or DM. Goal of 130 or  less. she will work on lifestyle mgt.   Problem # 4:  HYPERTENSION (ICD-401.9)  Her updated medication list for this problem includes:    Lisinopril-hydrochlorothiazide 20-25 Mg Tabs (Lisinopril-hydrochlorothiazide) .Marland Kitchen... Take one tablet daily  BP today: 118/78 Prior BP: 146/90 (06/11/2010)  Labs Reviewed: K+: 4.1 (07/11/2010) Creat: : 0.7 (07/11/2010)   Chol: 215 (07/11/2010)   HDL: 58.70 (07/11/2010)   LDL: 122 (07/18/2009)   TG: 130.0 (07/11/2010)  Good control on present meds.   Complete Medication List: 1)  Fluticasone Propionate 50 Mcg/act Susp (Fluticasone propionate) .... Spray 2 spray into both  nostrils once a day 2)  Premarin 0.3 Mg Tabs (Estrogens conjugated) .Marland Kitchen.. 1 by mouth once daily 3)  Lisinopril-hydrochlorothiazide 20-25 Mg Tabs (Lisinopril-hydrochlorothiazide) .... Take one tablet daily 4)  Aspirin 81 Mg Tbec (Aspirin) .... Take one tablet by mouth daily 5)  Coq10 100 Mg Caps (Coenzyme q10) .... Sometimes 6)  Protonix 40 Mg Tbec (Pantoprazole sodium) .Marland Kitchen.. 1 by mouth once daily x next 7 days, then once daily as needed 7)  Cefdinir 300 Mg Caps (Cefdinir) .Marland Kitchen.. 1 capsule two times a day   Orders Added: 1)  TLB-CBC Platelet - w/Differential [85025-CBCD] 2)  TLB-Udip w/ Micro [81001-URINE] 3)  Est. Patient Level IV [[address]]

## 2010-10-17 ENCOUNTER — Other Ambulatory Visit: Payer: Self-pay | Admitting: Cardiovascular Disease

## 2010-11-12 NOTE — Assessment & Plan Note (Signed)
[facility]                            CARDIOLOGY OFFICE NOTE   REKITA, MIOTKE                    MRN:          [id number]  DATE:05/03/2007                            DOB:          10-02-1935    Jessica Ware was seen in followup at the [facility] Cardiology office on  May 03, 2007.  Jessica Ware is a 75 year old woman with hypertension  and hyperlipidemia.  She continues to not feel very well overall.  She  does not have any specific complaints but generally feels tired.  She  has not been sleeping well at night.  She does not have chest pain,  dyspnea, edema, orthopnea, or PND.  She does feel like she is retaining  fluid but does not specifically describe edema in her legs.  She is  concerned about her lack of motivation to lose weight.  She has no other  specific complaints at this time.   CURRENT MEDICATION:  1. Aspirin 325 mg daily.  2. Lisinopril/HCT 20/12.5 mg daily.  3. Prevacid daily.  4. Zocor 20 mg q.h.s.  5. Premarin 0.625 mg daily.   EXAM:  She is alert and oriented and in no acute distress.  Weight is 151, blood pressure 138/80, heart rate 90, respiratory rate  16.  HEENT:  Normal.  NECK:  Normal carotid upstrokes without bruits.  Jugular venous pressure  was normal.  LUNGS:  Clear to auscultation bilaterally.  HEART:  Regular rate and rhythm without murmurs or gallops.  ABDOMEN:  Soft, nontender, no organomegaly, abdominal bruits.  EXTREMITIES:  No clubbing, cyanosis, or edema.  Peripheral pulses 2+ and  equal throughout.   EKG shows normal sinus rhythm and is within normal limits.   ASSESSMENT:  1. Hypertension.  While her blood pressure is just at the upper limits      of normal today, she tells me that at home her systolic blood      pressure has been running in the 140s, fairly consistently.  I have      asked her to increase the HCTZ component of her lisinopril HCT from      12.5 to 25 mg.  She will have a  BMET checked in 2 weeks.      Otherwise, we have made no further changes to her antihypertensive      regimen today.  2. Dyslipidemia.  Her lipids checked back in September are still      suboptimal.  Her LDL is 143 which is only a modest decrease from      her previous LDL which she was not on any medication.  Her HDL is      52, and total cholesterol is 215.  She requests to stay on      simvastatin due to cost issues, as she pays for her medication out-      of-pocket.  I have asked her to increase her Zocor from 20 to 40 mg      daily.   For followup, I would like to see Jessica Ware back in 6 months.  If she  has problems  in the interim, I would be happy to see her sooner.  She  should have lipids and LFTs checked in 3  months.  I tried to encourage her regarding the importance of a regular  exercise program, and the weight loss could significantly contribute to  improving her cholesterol panel.     [name]. Excell Seltzer, MD  Electronically Signed    MDC/MedQ  DD: 05/03/2007  DT: 05/04/2007  Job #: [id number]   cc:   Corwin Levins, MD

## 2010-11-12 NOTE — Assessment & Plan Note (Signed)
[facility]                            CARDIOLOGY OFFICE NOTE   Jessica Ware, Jessica Ware               MRN:          [id number]  DATE:03/01/2008                            DOB:          [birth year]    PRIMARY CARDIOLOGIST:  [name]. Excell Seltzer, MD   PRIMARY CARE PHYSICIAN:  Dr. Jonny Ruiz.   Jessica Ware is a 75 year old married white female patient who saw Dr.  Excell Seltzer in November 2008 for hypertension and hyperlipidemia.  Hydrochlorothiazide was increased for hypertension and she was started  on Zocor.  Zocor was increased for her hyperlipidemia.  She recently  missed her appointment with Dr. Excell Seltzer and wanted to see me rather than  wait for another appointment.  Her main concern is she does not like to  take statins because of all the information on the Internet that she has  read about them and wants to start on vitamin E and fish oil.  She also  has elevated blood pressure in the office today, but said it usually  runs 130/135 and is reluctant for me to increase her medications.  She  says she wants to try to exercise and lower her sodium for this.  She  recently had some muscle pains in her legs after much yard work,  gardening, and canning, and being on her feet a lot, but this ceased  with Advil.  She recently had labs and her cholesterol had come down  nicely on the simvastatin.  She denies any chest pain, palpitations,  dizziness, or presyncope.  She remains quite active, but does not  exercise as much she should.   CURRENT MEDICATIONS:  1. Premarin 0.625 mg daily.  2. Aspirin 325 mg daily.  3. Simvastatin 40 mg daily.  4. CoQ10 daily.  5. Lisinopril/hydrochlorothiazide 20/25 mg daily.  6. Protonix 40 mg daily.   PHYSICAL EXAMINATION:  GENERAL:  This is a pleasant young-looking 75-  year-old white female in no acute distress.  VITAL SIGNS:  Blood pressure 160/90, pulse 65, and weight 150.  NECK:  Without JVD, HJR, bruit, or thyroid  enlargement.  LUNGS:  Clear anterior, posterior, and lateral.  HEART:  Regular rate and rhythm at 65 beats per minute.  Normal S1 and  S2.  No murmur, rub, bruit, thrill, or heave noted.  ABDOMEN:  Soft without organomegaly, masses, lesions, or abnormal  tenderness.  EXTREMITIES:  Without cyanosis, clubbing, or edema.  She has good distal  pulses.   EKG normal sinus rhythm, normal EKG.   IMPRESSION:  1. Hypertension elevated today.  2. Dyslipidemia improved on increased simvastatin.   PLAN:  The patient is pretty adamant about coming off her simvastatin.  I agreed to let her do this and go ahead and try vitamin E and fish oil.  I also asked her to add soluble fiber daily to her diet.  I wanted to  increase her lisinopril to 40 mg and she refuses to do this.  I told her  she needs to cut back to 2 g sodium diet and increase her exercise to  lower blood pressure.  She will see Dr. Excell Seltzer back  in 6 months.  I have  asked her to see Dr. Jonny Ruiz as well.  She was asking for a prescription  for Premarin, I told her she needs to follow up with Dr. Jonny Ruiz for this.      Jacolyn Reedy, PA-C  Electronically Signed      Duke Salvia, MD, [facility]  Electronically Signed   ML/MedQ  DD: 03/01/2008  DT: 03/02/2008  Job #: [id number]

## 2010-11-15 NOTE — Assessment & Plan Note (Signed)
[facility]                                 ON-CALL NOTE   KRISTEENA, MEINEKE               MRN:          [id number]  DATE:01/20/2007                            DOB:          1936-02-20    TIME:  9:09 p.m.   PHONE NUMBER:  [phone].   CHIEF COMPLAINT:  Numbness in both hands.  Actually, when I spoke to her  on the phone, it was numbness in one hand.  The patient states she had  been peeling zucchinis today.  She had experienced some numbness in her  right hand, it does not feel like tingling, more like decreased  sensation.  She has no loss of grip or strength at all.  She thinks the  tip of one of her fingers on her left hand is a tiny bit numb, too, but  not nearly as much as the right.  She has had some problems ongoing with  left shoulder pain when she sleeps at night.  Otherwise, denies chest  pain or pain anywhere else.  She does not have any difficulty speaking,  no headaches, no weakness or other neurologic deficits.  I told her at  the current time I could not completely reassure her that this was not  the beginning of a stroke, but that we need to watch it very carefully.  It is possible that she has some carpal tunnel or nerve irritation from  her activities today, but I could not reassure her 100%.  She has taken  an aspirin already.  I told her to keep a close eye on her symptoms  tonight as the numbness begins to get worse, or proceed up her arm, or  she develops any weakness whatsoever.  Any difficulties of speech or new  symptoms, she is to go the emergency room for evaluation right away.  Otherwise, she is going to call Dr. Raphael Gibney office for a followup in the  morning.     Marne A. Tower, MD  Electronically Signed    MAT/MedQ  DD: 01/20/2007  DT: 01/21/2007  Job #: [id number]

## 2010-11-15 NOTE — Assessment & Plan Note (Signed)
[facility]                           GASTROENTEROLOGY OFFICE NOTE   LINDAANN, GRADILLA                    MRN:          [id number]  DATE:05/05/2006                            DOB:          July 14, 1935    PROBLEM:  Dysphagia.   Ms. Jessica Ware is a pleasant 75 year old white female referred through the  courtesy of Dr. Jonny Ruiz for evaluation.  She has been complaining of  progressive dysphagia to solids.  She has rare pyrosis.  She does complain  of hoarseness.  She was recently hospitalized briefly for pain under the  left breast.  It was felt to be due to musculoskeletal pain.  She denies  odynophagia.  She is on no gastric irritants, including nonsteroidals.   PAST MEDICAL HISTORY:  Pertinent for hypertension.  She has a history of  kidney stones.  She is status post partial hysterectomy.   FAMILY HISTORY:  Pertinent for both parents with heart disease.   MEDICATIONS:  Premarin, aspirin, lisinopril.   She is allergic to SULFA.   She does not smoke or drink.  She is married.   REVIEW OF SYSTEMS:  Positive for frequent cough, joint pains, and sleeping  problems.   PHYSICAL EXAMINATION:  VITAL SIGNS:  Pulse 60, blood pressure 120/80, weight  149.  HEENT: EOMI.  PERRLA. Sclerae are anicteric.  Conjunctivae are pink.  NECK:  Supple without thyromegaly, adenopathy or carotid bruits.  CHEST:  Clear to auscultation and percussion without adventitious sounds.  CARDIAC:  Regular rhythm; normal S1 S2.  There are no murmurs, gallops or  rubs.  ABDOMEN:  Bowel sounds are normoactive.  Abdomen is soft, non-tender and non-  distended.  There are no abdominal masses, tenderness, splenic enlargement  or hepatomegaly.  EXTREMITIES:  Full. Range of motion.  No cyanosis, clubbing or edema.  RECTAL:  Deferred.   IMPRESSION:  1. Dysphagia, likely secondary to peptic esophageal stricture.  2. Hoarseness.  This is probably related to gastroesophageal reflux     disease.   RECOMMENDATION:  1. Prevacid 30 mg daily.  2. Upper endoscopy with balloon dilatation as indicated.     Barbette Hair. Arlyce Dice, MD,FACG  Electronically Signed    RDK/MedQ  DD: 05/05/2006  DT: 05/06/2006  Job #: [id number]

## 2010-11-15 NOTE — Consult Note (Signed)
NAMEDAMANI, RANDO             ACCOUNT NO.:  [id number]   MEDICAL RECORD NO.:  [id number]          PATIENT TYPE:  INP   LOCATION:  3732                         FACILITY:  [facility]   PHYSICIAN:  Bevelyn Buckles. Bensimhon, MDDATE OF BIRTH:  Jan 29, 1936   DATE OF CONSULTATION:  04/17/2006  DATE OF DISCHARGE:  04/18/2006                                   CONSULTATION   [facility] CONSULT:   REQUESTING PHYSICIAN:  Dr. Thomos Lemons.   REASON FOR CONSULTATION:  Chest pain.   HISTORY OF PRESENT ILLNESS:  Ms. Larue is a very pleasant 75 year old  woman with a history of hypertension, hyperlipidemia and a strong family  history of coronary artery disease.  She denies any known history of  personal coronary artery disease.  She states that this morning at 9:30,  while she was on the telephone, she developed a sharp left-sided chest pain  under her breast.  This waxed and waned throughout the morning.  There is no  associated nausea, vomiting or shortness of breath.  She finally went to Dr.  Artist Pais who obtained an EKG which was normal.  However, given the nature of her  pain, she was admitted for observation.  She states that at baseline she  walks on the treadmill for 1 mile and 30 minutes several times a week  without difficulty.  She denies any recent fevers or chills.  The pain is  not positional but does get worse with deep breathing or turning.   REVIEW OF SYSTEMS:  Her review of systems is notable for occasional  palpitations as well as dysphasia.  She also has reflux symptoms at times.  She denies any bright red blood per rectum, no melena.  Remainder of her  review of systems is negative except for HPI and problem list.   PAST MEDICAL HISTORY:  1. Hypertension.  2. Hypercholesterolemia.  3. Gastroesophageal reflux disease with hiatal hernia.  4. Allergic rhinitis.  5. History of kidney stones.  6. History of retinal cyst.   MEDICATIONS:  Lisinopril/hydrochlorothiazide 20/12.5  daily, aspirin 325,  Premarin 0.625 mg a day and Protonix 40 a day.   ALLERGIES:  SULFA.   SOCIAL HISTORY:  She lives in [place] with her husband, she is retired,  no tobacco, no alcohol.  Mother died at 60 due to MI.  Father died at 52 due  to an MI.  She has one brother who had an MI in his 26s.   PHYSICAL EXAMINATION:  GENERAL:  She is well appearing, no acute distress.  Respirations are unlabored.  VITAL SIGNS:  Blood pressure is 160/106, heart rate 77.  She is saturating  94% on room air.  She is afebrile.  HEENT:  Sclerae are anicteric.  EOMI.  There is no xanthelasma.  Mucous  membranes are moist.  Neck is supple.  No JVD.  Carotids are 2+ bilaterally  without any bruits.  There is no lymphadenopathy or thyromegaly.  CARDIAC:  She has a regular rate and rhythm with no murmurs, rubs or  gallops.  She is tender on her chest wall under her left breast.  ABDOMEN:  Soft, nontender, nondistended, no hepatosplenomegaly, no bruits,  no masses, good bowel sounds.  There is no right upper quadrant tenderness.  RESPIRATORY:  Lungs are clear.  EXTREMITIES:  Warm with no cyanosis, clubbing or edema.  Distal pulses are  strong.  NEURO:  She is alert, oriented x3.  Cranial nerves II-XII are intact.  She  moves all four extremities without difficulty.  There are no rashes or  arthropathies.   STUDIES:  Chest x-ray is pending.  EKG from the office shows sinus rhythm at  a rate of 88 with no ST-T wave changes.  Labs are pending.   ASSESSMENT AND PLAN:  1. Atypical chest pain.  I doubt this is cardiac, I think it is probably      musculoskeletal.  However, given her risk factors would rule out      myocardial infarction.  If cardiac enzymes are negative, she can be      discharged home in the morning with a treadmill Myoview as an      outpatient.  I would consider a gastrointestinal workup for her      dysphagia.  I do not think she needs heparin at this point unless her      enzymes  are positive.  2. Hypertension.  This poorly controlled.  We will leave management to the      primary team.      Bevelyn Buckles. Bensimhon, MD  Electronically Signed     DRB/MEDQ  D:  04/17/2006  T:  04/19/2006  Job:  [id number]

## 2010-11-15 NOTE — H&P (Signed)
NAMEBRADLEIGH, SONNEN             ACCOUNT NO.:  [id number]   MEDICAL RECORD NO.:  [id number]          PATIENT TYPE:  INP   LOCATION:  3732                         FACILITY:  [facility]   PHYSICIAN:  Barbette Hair. Artist Pais, DO      DATE OF BIRTH:  [birth year]   DATE OF ADMISSION:  04/17/2006  DATE OF DISCHARGE:                                HISTORY & PHYSICAL   HISTORY OF PRESENT ILLNESS:  The patient is a 75 year old white female with  a history of poorly controlled hypertension and elevated cholesterol who  presents with acute left sided chest pain.  The patient states that at 9:30  this morning, the patient was on the phone when she suddenly experienced  severe left sided chest pain underneath her left breast.  It was not  exertional, but the pain did radiate to the center of her chest and she  initially rates the severity as 10 out of 10.  Currently, the pain is an 8  out of 10 and the patient notes she has increased pain with taking a deep  breath/inhalation.   She notes she has been seen by her primary care physician in the past with  some concerns of atypical chest pain.  The patient was scheduled to have a  stress test in the past.  She missed two of her previous appointments due to  family events.  She does have a history of dysphagia and Dr. Jonny Ruiz had  recommended a GI referral.  Review of chart notes that she has not followed  up with GI for EGD.   PAST MEDICAL HISTORY SUMMARY:  1. Hypertension poorly controlled.  2. History of hormone replacement therapy.  3. History of allergies.  4. History of GERD.  5. Status post hysterectomy.   CURRENT MEDICATIONS:  1. Premarin 0.625 mg one a day.  2. Aspirin 325 mg p.r.n.  3. Lisinopril/hydrochlorothiazide 20/12.5 one a day.  4. Prevacid 30 mg once a day.   ALLERGIES:  SULFA AND YEAST PRODUCTS.   FAMILY HISTORY:  Mother died at age 43, had a history of stroke and  hypertension.  Father died at age 47, had a history of MI, hypertension,  and  prostate cancer.   HABITS:  Denies any alcohol or tobacco, lives with her husband.   The patient's daughter is noted to have panic disorder.   PAST MEDICAL HISTORY SUMMARY:  1. Hypertension.  2. Hyperlipidemia.  3. Allergies.  4. Anxiety and depression.  5. GERD.  6. Remote history of nephrolithiasis.  7. History of macular edema of the right eye.   REVIEW OF SYSTEMS:  No fevers or chills.  Cardiovascular/respiratory  complaints as noted above.  The patient is also noted to have intermittent  dysphagia to solids, especially bread and pieces of chicken.  All other  systems negative.   PHYSICAL EXAMINATION:  VITAL SIGNS:  Weight 150 pounds, temperature 98, pulse 72, and blood  pressure 176/95 in the left arm in the seated position.  GENERAL:  The patient is a pleasant and somewhat anxious 75 year old white  female in no apparent distress.  HEENT:  Normocephalic, atraumatic, pupils were equal and reactive to light  bilaterally.  Extraocular movements intact.  The patient was anicteric.  Conjunctivae within normal limits.  External auditory canals and tympanic  membranes were clear bilaterally.  NECK:  Supple, no adenopathy, carotid bruit, or thyromegaly.  CHEST:  Exam normal respiratory effort, chest is clear to auscultation  bilaterally, the patient had no palpable chest tenderness underneath her  left breast or sternum.  CARDIOVASCULAR:  Regular rate and rhythm, no significant murmurs, gallops,  and rubs appreciated.  ABDOMEN:  Soft, nontender, positive bowel sounds, no organomegaly.  MUSCULOSKELETAL:  No cyanosis, clubbing, and edema.  SKIN:  Warm and dry.  NEUROLOGICAL:  Cranial nerves 2-12 grossly intact.  She was nonfocal.   EKG was performed in the office which showed normal sinus rhythm at 76 beats  per minute, the patient has nonspecific ST changes.   IMPRESSION:  1. Atypical chest pain in a 75 year old white female with multiple risk      factors.  2.  Hypertension uncontrolled.  3. History of hyperlipidemia, not currently on a statin.  4. History of anxiety/depression.  5. Hormone replacement therapy.  6. Chronic GERD with dysphagia.   RECOMMENDATIONS:  The patient will be admitted for observation.  The patient  has multiple risk factors for coronary artery disease.  In addition, the  patient is on hormone replacement and at a higher risk for DVT.  We will  obtain a D-dimer, if positive, a CT of the chest with PE protocol will be  ordered.  The patient will be started on aspirin, continued on her home anti-  hypertensives, and we will consult cardiology for evaluation.  The patient  may need inpatient cardiac stress test.      Barbette Hair. Artist Pais, DO  Electronically Signed    RDY/MEDQ  D:  04/17/2006  T:  04/17/2006  Job:  [id number]   cc:   Corwin Levins, MD

## 2010-11-15 NOTE — Discharge Summary (Signed)
NAME:  Jessica Ware, Jessica Ware             ACCOUNT NO.:  [id number]   MEDICAL RECORD NO.:  [id number]          PATIENT TYPE:  INP   LOCATION:  3732                         FACILITY:  [facility]   PHYSICIAN:  Sean A. Everardo All, MD    DATE OF BIRTH:  1935/07/07   DATE OF ADMISSION:  04/17/2006  DATE OF DISCHARGE:  04/18/2006                                 DISCHARGE SUMMARY   REASON FOR ADMISSION:  Chest pain.   HISTORY OF PRESENT ILLNESS:  A 75 year old woman admitted by Dr. Artist Pais on  April 17, 2006 for chest pain.  Please refer to his dictated history and  physical for details.   HOSPITAL COURSE:  The patient was admitted and cardiac enzymes were  negative.  She was seen in consultation by cardiology, who recommended  outpatient Cardiolite.  She was given symptomatic therapy.  D-Dimer was  elevated, but a CT scan was negative for pulmonary emboli.   By April 18, 2006, she was alert, oriented, feeling better, and eating her  usual diet.  She was thus discharged home in good condition.   She complained of malodorous cloudy urine during hospitalization and a  urinalysis and cultures are obtained prior to her discharge.  She is also  prescribed Cipro.   DISCHARGE DIAGNOSES:  1. Chest pain, likely musculoskeletal.  2. Urinary tract infection.  3. Otherwise same as prior to admission.   MEDICATIONS:  1. Isosorbide mononitrate 20 mg daily.  2. Darvocet-N 100 1 or 2 every 6 hours as needed for pain #50 with 1      future refill allowed.  3. Cipro 500 mg twice a day for a week.  4. Aspirin 325 mg a day.  5. Otherwise the same as on Dr. Olegario Messier history and physical.   DIET AND ACTIVITY:  No restriction.   FOLLOWUP:  1. Treadmill Cardiolite April 22, 2006 at 7:45 a.m.  2. See Dr. Jonny Ruiz several days to a week after that for follow up.           ______________________________  Cleophas Dunker. Everardo All, MD     SAE/MEDQ  D:  04/18/2006  T:  04/19/2006  Job:  [id number]   cc:   Corwin Levins, MD

## 2010-11-15 NOTE — Assessment & Plan Note (Signed)
[facility]                            CARDIOLOGY OFFICE NOTE   HANAAN, GANCARZ                    MRN:          [id number]  DATE:10/01/2006                            DOB:          01/06/1936    Jessica Ware presents to the [facility] as an  outpatient on October 01, 2006. She is a delightful 75 year old woman who  had an episode of chest pain back in October of 2007. She was admitted  overnight and ruled out for a myocardial infarction at that time. She  ultimately underwent an exercise Myoview study on October 24th where she  exercised for five minutes and 29 seconds without chest pain or ST  segment changes on her ECG. She had no perfusion abnormalities and a  normal left ventricular ejection fraction of 66%.   In the interim, she has been doing well from a symptomatic standpoint.  She walks on a treadmill approximately 30 minutes daily without  problems. She has some dyspnea with walking up hills, but no symptoms on  her treadmill. She has had no recurrent episodes of chest pain. She  denies lightheadedness, syncope, orthopnea, PND or edema.   She has multiple concerns about her cardiac risks. She inquires about  any testing that can be done to better risk stratify her and determine  whether she has any coronary artery disease.   CURRENT MEDICATIONS:  1. Premarin 0.625 mg daily.  2. Aspirin 325 mg as needed.  3. Lisinopril/HCT 20/12.5 mg daily.  4. Prevacid one daily.   ALLERGIES:  SULFA.   PAST MEDICAL HISTORY:  Is pertinent for the following:  1. Essential hypertension.  2. Gastroesophageal reflux disease.  3. Hysterectomy in 1981.  4. Kidney stone surgery in 1985.  5. Two miscarriages followed by D&C back in the 1960s.   SOCIAL HISTORY:  The patient is married with three children. She is a  Fut[employer] trader. She does not smoke cigarettes, drink alcohol or have any  history of illicit drug use. She exercises  regularly as detailed above.   FAMILY HISTORY:  Her mother died at age 21 of a cerebral hemorrhage. Her  father died at age 37 of a myocardial infarction.   REVIEW OF SYSTEMS:  A complete 12-point review of systems was performed.  Pertinent positives included seasonal allergies, constipation, fatigue,  hiatal hernia, gastroesophageal reflux, arthritis, thyroid problems. All  other systems were reviewed and are negative except as detailed above.   PHYSICAL EXAMINATION:  The patient is alert and oriented. She is in no  acute distress. Weight is 148 pounds. Blood pressure is 148/94. On my  recheck it was 164/94, heart rate 76, respiratory rate is 16.  HEENT: Is normal.  NECK: Normal carotid upstrokes without bruits. Jugular venous pressure  is normal. There is no thyromegaly or thyroid nodules.  LUNGS:  Clear to auscultation bilaterally.  CHEST: Shows equal excursion and no chest wall deformities.  HEART: The apex is discrete and nondisplaced. There is no right  ventricular heave or lift.  The heart is regular rate and rhythm without  murmurs or gallops.  ABDOMEN:  Soft and nontender. No organomegaly. No abdominal bruits or  masses.  BACK: There is no paraspinal or flank tenderness.  EXTREMITIES: No clubbing, cyanosis or edema. Peripheral pulses are 2+  and equal throughout. There are some varicosities in the lower  extremities bilaterally around the ankles. There are  no femoral artery  bruits.  SKIN: Is warm and dry without rash.  NEUROLOGIC: Cranial nerves II-XII are intact. Strength is 5/5 and equal  in the arms and legs bilaterally.   EKG: Demonstrates normal sinus rhythm and is within normal limits.   Studies reviewed included the stress Myoview study as described as well  as ABIs which were performed in November of 2005. These were normal with  triphasic brisk arterial waveforms and ABI of 1.1 in both legs.   ASSESSMENT:  Jessica Ware is currently stable from a  cardiovascular  standpoint. She does not have any documented vascular disease. Her  cardiac risk factors include only essential hypertension. She does have  coronary artery disease in the family, but not at a premature age. Her  lipids were elevated back in August of 2007, with an LDL cholesterol of  140, total cholesterol of 218 and an HDL of 61. She is a borderline  candidate for lipid lowering and has concerns about taking statins. I  think she is someone whom we could look for signs of subclinical  atherosclerosis with carotid duplex scanning and a CRP test. If she has  either carotid plaque or an elevated CRP, I would favor aggressive  statin therapy. If not, I think she could be given a trial of lifestyle  modification.   She does not have any evidence of vascular disease based on her physical  examination and she has a normal stress nuclear study, which places her  at low risk of future cardiac events. I would like to see her back in  approximately two weeks after her carotid ultrasound and CRP are done  for further discussion. Her blood pressure control is borderline and she  will need to  continue to monitor her blood pressure at home. We asked her to bring  her cuff in for comparison at the time of her return visit as well as  her home blood pressure readings.     [name]. Excell Seltzer, MD  Electronically Signed    MDC/MedQ  DD: 10/01/2006  DT: 10/01/2006  Job #: [id number]

## 2010-11-15 NOTE — Assessment & Plan Note (Signed)
[facility]                            CARDIOLOGY OFFICE NOTE   Jessica Ware, AMBROSINO                    MRN:          [id number]  DATE:10/29/2006                            DOB:          1935-11-17    Jessica Ware returns for followup as an outpatient at the [facility]  Cardiology [facility] on Oct 29, 2006.  [name] was initially evaluated  here on April 3 because of concerns about her overall cardiac risk.  She  had an episode of chest pain last year and was evaluated at that time  with an exercise Myoview study that was unremarkable.  See my initial  note from April 3 for details.   In the interim, Jessica Ware has been doing well.  She underwent a lipid  panel as well as a CRP and carotid ultrasound to help risk-stratify her.  She has cardiac risk factors of hypertension, dyslipidemia, and a family  history of coronary artery disease.   She has had no chest pain, dyspnea, or other complaints in the interim.  She brings in home blood pressure readings and her blood pressure ranges  from a low of 111 to a high of 146 systolic over a low of 72 and a high  of 86 diastolic.  The majority of her blood pressures are less than  135/85.   Her lipids are reviewed today and her cholesterol is 223 with an LDL of  151, an HDL of 54, triglycerides of 97, and a cholesterol to HDL ratio  of 4.1.  Her CRP was 7.0 with the normal range being 0-5.  Her carotid  ultrasound showed mild bilateral intimal thickening without any  significant plaque disease.   CURRENT MEDICATIONS:  1. Premarin 0.625 mg daily.  2. Aspirin 325 mg daily.  3. Lisinopril/hydrochlorothiazide 20/12.5 mg daily.  4. Prevacid daily.   At today's visit, her weight is 148 pounds, blood pressure 140/76, heart  rate 75.  The remainder of the physical exam is deferred as our time was spent in  discussion.   ASSESSMENT:  Jessica Ware is stable from a cardiac standpoint.  Her  cardiovascular-related problems are as follows.  1. Hyperlipidemia.  She certainly has hypercholesterolemia with an      elevated overall cholesterol as well as an elevated LDL      cholesterol.  I would favor treating her with Statin therapy in the      setting of her concomitant risk factors and elevated CRP.  She is      reluctant about taking cholesterol medications and I thought we      would try her on a low dose of Statin therapy with Zocor 20 mg      daily.  She does not have a drug plan and would like to try generic      medication.  I would like to recheck her lipids and LFTs in 12      weeks, after starting Zocor.  We had a long discussion about      potential side effects and things to look out for.  2. Essential hypertension.  I think her blood pressure control is      reasonable at this point.  We again had an at-length discussion      regarding this risk factor.  She complains of dry eyes and relates      this to taking the lisinopril hydrochlorothiazide.  I think she      should stay on these medications and advised to try some saline eye      drops.  I think it is likely that she will require more medication      in the future, but for now it seems reasonable to continue her on      her current doses of lisinopril and hydrochlorothiazide.   For followup I will be in contact with her after her lipids and LFTs are  drawn in 3 months, and I would like to see her back in clinic in 6  months.  She will continue longitudinal followup with Dr. Jonny Ruiz.     [name]. Excell Seltzer, MD  Electronically Signed    MDC/MedQ  DD: 10/29/2006  DT: 10/29/2006  Job #: [id number]   cc:   Corwin Levins, MD

## 2010-12-16 ENCOUNTER — Telehealth: Payer: Self-pay

## 2010-12-16 MED ORDER — ESTROGENS CONJUGATED 0.3 MG PO TABS: 0.3000 mg | ORAL_TABLET | Freq: Every day | ORAL | Status: DC

## 2010-12-16 NOTE — Telephone Encounter (Signed)
Patient called [facility] requesting refills on her premarin. Would like this sent to [facility] in [place]

## 2011-03-14 ENCOUNTER — Telehealth: Payer: Self-pay | Admitting: Cardiovascular Disease

## 2011-03-14 DIAGNOSIS — I10 Essential (primary) hypertension: Secondary | ICD-10-CM

## 2011-03-14 DIAGNOSIS — E785 Hyperlipidemia, unspecified: Secondary | ICD-10-CM

## 2011-03-14 NOTE — Telephone Encounter (Signed)
Pt calling to schedule appt to see Dr. Excell Seltzer as instructed in her reminder letter received. Pt however wanted to schedule lab work to be done prior to pt scheduling appt. Please return pt call to discuss.

## 2011-03-14 NOTE — Telephone Encounter (Signed)
Spoke with pt. She will come in for fasting lipid, liver and bmp the first week of December --has appt. With Dr. Excell Seltzer on June 10, 2011.

## 2011-03-24 ENCOUNTER — Encounter: Payer: Self-pay | Admitting: Internal Medicine

## 2011-03-25 ENCOUNTER — Other Ambulatory Visit (INDEPENDENT_AMBULATORY_CARE_PROVIDER_SITE_OTHER): Payer: Medicare Other

## 2011-03-25 ENCOUNTER — Ambulatory Visit (INDEPENDENT_AMBULATORY_CARE_PROVIDER_SITE_OTHER): Payer: Medicare Other | Admitting: Internal Medicine

## 2011-03-25 VITALS — BP 158/76 | HR 88 | Temp 97.7°F | Wt 148.0 lb

## 2011-03-25 DIAGNOSIS — K6389 Other specified diseases of intestine: Secondary | ICD-10-CM

## 2011-03-25 DIAGNOSIS — M791 Myalgia, unspecified site: Secondary | ICD-10-CM

## 2011-03-25 DIAGNOSIS — I10 Essential (primary) hypertension: Secondary | ICD-10-CM

## 2011-03-25 DIAGNOSIS — IMO0001 Reserved for inherently not codable concepts without codable children: Secondary | ICD-10-CM

## 2011-03-25 DIAGNOSIS — R5381 Other malaise: Secondary | ICD-10-CM

## 2011-03-25 DIAGNOSIS — R5383 Other fatigue: Secondary | ICD-10-CM

## 2011-03-25 DIAGNOSIS — R198 Other specified symptoms and signs involving the digestive system and abdomen: Secondary | ICD-10-CM

## 2011-03-25 LAB — COMPREHENSIVE METABOLIC PANEL
AST: 22 U/L (ref 0–37)
Albumin: 3.7 g/dL (ref 3.5–5.2)
Alkaline Phosphatase: 105 U/L (ref 39–117)
BUN: 15 mg/dL (ref 6–23)
Glucose, Bld: 93 mg/dL (ref 70–99)
Potassium: 3.7 mEq/L (ref 3.5–5.1)
Sodium: 139 mEq/L (ref 135–145)
Total Bilirubin: 1.2 mg/dL (ref 0.3–1.2)

## 2011-03-25 MED ORDER — DILTIAZEM HCL ER 180 MG PO CP24: 180.0000 mg | ORAL_CAPSULE | Freq: Every day | ORAL | Status: DC

## 2011-03-25 MED ORDER — MELOXICAM 15 MG PO TABS: 15.0000 mg | ORAL_TABLET | Freq: Every day | ORAL | Status: DC

## 2011-03-25 NOTE — Progress Notes (Signed)
  Subjective:    Patient ID: Jessica Ware [facility], female    DOB: 05/28/1936, 75 y.o.   MRN: [id number]  HPI Jessica Ware presents for follow-up. She had an elevation of blood pressure to SBP 170's over DBP 90. She doubled her lisinopril/hct 20/25 to bid and has had a modest decrease in BP. No significant symptoms during high BP period.   Renal function - she has noticed a slower stream. She is concerned about renal function.  Bowel habit - taking a lot of flax seed and ruffage.   She c/o chronic fatigue all the time. She can be refreshed by a 30 minute nap. Not a snorer. She has been told by her husband " to turn over, you're not breathing right. "  She has been taking NSAID, advil, aleve,  for right shoulder pain, pain across the upper chest, for knee pain (she has had steroid joint injection). Patient does have a h/o esophageal stricture. She is concerned about effect of drugs on her kidney and her esophagus  I have reviewed the patient's medical history in detail and updated the computerized patient record.      Review of Systems System review is negative for any constitutional, cardiac, pulmonary, GI or neuro symptoms or complaints other than as described in the HPI.     Objective:   Physical Exam Vitals reviewed - BP mildly elevated Gen'l - WNWD whie woman in no distress HEENT - C&S clear, pupils round, no gross visual abnormality, fundi with normal disk margins, no vascular changes Chest - normal respiration Cor - 2+ radial pulse, RRR Neuro - aloert and oriented, normal strength, normal gait       Assessment & Plan:

## 2011-03-25 NOTE — Patient Instructions (Addendum)
Blood pressure - modest elevation today. No signs of problem on exam of heart and eye. Plan - continue lisinopril/hct 20/25 once a day; add diltiazem 180 mg once a day. BP check in 2 weeks.   Renal function - lab is ordered for today which will check creatinine and potassium.  Fatigue - will check thyroid function today. Need to consider a sleep study to rule out sleep apnea as a cause of fatigue. Also consider depression as a cause of fatigue.  Muscular-skeletal pain - will try Rx meloxicam 15mg  once a day for control of symptoms - similar to celebrex but generic.   Bowel habit - ok to continue with flax seed and ruffage. Add milk of magnesia 15 cc (1/2 capful) at bedtime on a regular basis.

## 2011-03-26 DIAGNOSIS — M791 Myalgia, unspecified site: Secondary | ICD-10-CM | POA: Insufficient documentation

## 2011-03-26 DIAGNOSIS — R198 Other specified symptoms and signs involving the digestive system and abdomen: Secondary | ICD-10-CM | POA: Insufficient documentation

## 2011-03-26 LAB — TSH: TSH: 1.97 u[IU]/mL (ref 0.35–5.50)

## 2011-03-26 NOTE — Assessment & Plan Note (Signed)
BP Readings from Last 3 Encounters:  03/25/11 158/76  08/05/10 118/78  06/11/10 146/90   A trend for increasing blood pressure is noted in the longitudinal record.   Plan - continue lisnopril/HCT at present dose            Lab - metabolic panel            Add diltiazem 180 mg daily           BP follow-up: she may call in values from home monitoring

## 2011-03-26 NOTE — Assessment & Plan Note (Signed)
She has multiple aches and pains joints and muscles. She has achieved relief with NSAIDs. No evidence of adverse effects.  Plan - trial of meloxicam 15 mg qd           GI precautions reviewed.

## 2011-03-26 NOTE — Assessment & Plan Note (Signed)
Patient reports constipation but really irregular bowel habit. This does cause some discomfort.  Plan - continue ruffage, etc.           Trial of MOM

## 2011-03-26 NOTE — Assessment & Plan Note (Signed)
Persistent problem. Patient has concern for underlying metabolic etiology and would like lab work.   Plan:  thyroid function testing. Anemia was r/o Feb '12 with normal CBC. Renal function and lytes are normal           Recommend sleep study - she will consider this.

## 2011-03-28 ENCOUNTER — Encounter: Payer: Self-pay | Admitting: Internal Medicine

## 2011-06-05 ENCOUNTER — Other Ambulatory Visit: Payer: Medicare Other | Admitting: *Deleted

## 2011-06-10 ENCOUNTER — Ambulatory Visit: Payer: Medicare Other | Admitting: Cardiovascular Disease

## 2011-06-30 ENCOUNTER — Other Ambulatory Visit: Payer: Medicare Other | Admitting: *Deleted

## 2011-07-02 ENCOUNTER — Encounter: Payer: Self-pay | Admitting: Gastroenterology

## 2011-07-03 ENCOUNTER — Other Ambulatory Visit (INDEPENDENT_AMBULATORY_CARE_PROVIDER_SITE_OTHER): Payer: Medicare Other | Admitting: *Deleted

## 2011-07-03 ENCOUNTER — Other Ambulatory Visit: Payer: Self-pay | Admitting: Cardiovascular Disease

## 2011-07-03 DIAGNOSIS — E785 Hyperlipidemia, unspecified: Secondary | ICD-10-CM

## 2011-07-03 DIAGNOSIS — I10 Essential (primary) hypertension: Secondary | ICD-10-CM

## 2011-07-03 LAB — BASIC METABOLIC PANEL
CO2: 30 mEq/L (ref 19–32)
Chloride: 101 mEq/L (ref 96–112)
Creatinine, Ser: 0.8 mg/dL (ref 0.4–1.2)
Potassium: 4.4 mEq/L (ref 3.5–5.1)
Sodium: 139 mEq/L (ref 135–145)

## 2011-07-03 LAB — LDL CHOLESTEROL, DIRECT: Direct LDL: 112.1 mg/dL

## 2011-07-03 LAB — LIPID PANEL
Cholesterol: 202 mg/dL — ABNORMAL HIGH (ref 0–200)
HDL: 65.5 mg/dL (ref >39.00)
Total CHOL/HDL Ratio: 3
VLDL: 27.6 mg/dL (ref 0.0–40.0)

## 2011-07-03 LAB — HEPATIC FUNCTION PANEL
Albumin: 3.5 g/dL (ref 3.5–5.2)
Total Protein: 6.6 g/dL (ref 6.0–8.3)

## 2011-07-04 ENCOUNTER — Other Ambulatory Visit: Payer: Medicare Other | Admitting: *Deleted

## 2011-07-08 ENCOUNTER — Encounter: Payer: Self-pay | Admitting: Cardiovascular Disease

## 2011-07-08 ENCOUNTER — Ambulatory Visit (INDEPENDENT_AMBULATORY_CARE_PROVIDER_SITE_OTHER): Payer: Medicare Other | Admitting: Cardiovascular Disease

## 2011-07-08 VITALS — BP 126/80 | HR 76 | Ht 63.0 in | Wt 150.4 lb

## 2011-07-08 DIAGNOSIS — I10 Essential (primary) hypertension: Secondary | ICD-10-CM

## 2011-07-08 DIAGNOSIS — IMO0001 Reserved for inherently not codable concepts without codable children: Secondary | ICD-10-CM

## 2011-07-08 DIAGNOSIS — M791 Myalgia, unspecified site: Secondary | ICD-10-CM

## 2011-07-08 DIAGNOSIS — E785 Hyperlipidemia, unspecified: Secondary | ICD-10-CM

## 2011-07-08 NOTE — Patient Instructions (Signed)
Your physician wants you to follow-up in: 12 months.  You will receive a reminder letter in the mail two months in advance. If you don't receive a letter, please call our office to schedule the follow-up appointment.  Your physician recommends that you continue on your current medications as directed. Please refer to the Current Medication list given to you today.   

## 2011-07-08 NOTE — Assessment & Plan Note (Signed)
The patient has leg pains but there is no evidence for peripheral arterial disease. Her peripheral pulse exam is completely normal and she does not have symptoms of intermittent claudication. There are no femoral, abdominal, or carotid bruits. I reassured the patient. I did offer her vascular ultrasound screening but she felt reassured by our discussion and did not wish to undergo these studies.

## 2011-07-08 NOTE — Assessment & Plan Note (Signed)
Blood pressure is well controlled 

## 2011-07-08 NOTE — Progress Notes (Signed)
HPI:  This is a 76 year old woman presenting for followup of hypertension and hyperlipidemia.  She has noted elevated blood pressure and her medication has been adjusted by Dr. Debby Bud. Most recent lipid panel from January 3 demonstrates a cholesterol of 202, triglycerides 138, HDL 66, and LDL 112.  The patient denies chest pain or dyspnea. She is not engage in regular exercise. She has no leg pain with ambulation, but she does complain of leg aching at night time. She is concerned about peripheral arterial disease. She denies orthopnea, PND, leg ulcerations, or palpitations.  Outpatient Encounter Prescriptions as of 07/08/2011  Medication Sig Dispense Refill  . aspirin 81 MG tablet Take 81 mg by mouth daily.        Marland Kitchen co-enzyme Q-10 30 MG capsule Take 30 mg by mouth daily. Every other day      . estrogens, conjugated, (PREMARIN) 0.3 MG tablet Take 1 tablet (0.3 mg total) by mouth daily. Take daily for 21 days then do not take for 7 days.  30 tablet  6  . fluticasone (FLOVENT DISKUS) 50 MCG/BLIST diskus inhaler Inhale 1 puff into the lungs 2 (two) times daily.        Marland Kitchen lisinopril-hydrochlorothiazide (PRINZIDE,ZESTORETIC) 20-25 MG per tablet TAKE 1 TABLET DAILY  30 tablet  6  . pantoprazole (PROTONIX) 40 MG tablet Take 40 mg by mouth daily.        Marland Kitchen DISCONTD: cefdinir (OMNICEF) 300 MG capsule Take 300 mg by mouth 2 (two) times daily.        Marland Kitchen DISCONTD: diltiazem (DILACOR XR) 180 MG 24 hr capsule Take 1 capsule (180 mg total) by mouth daily.  30 capsule  11  . DISCONTD: meloxicam (MOBIC) 15 MG tablet Take 1 tablet (15 mg total) by mouth daily.  30 tablet  11    Allergies  Allergen Reactions  . Penicillins   . Sulfonamide Derivatives     Past Medical History  Diagnosis Date  . Allergic rhinitis   . Anxiety   . Depression   . GERD (gastroesophageal reflux disease)   . Hyperlipidemia   . Hypertension   . Obesity   . History of renal stone     ROS: Negative except as per HPI  BP 126/80   Pulse 76  Ht 5\' 3"  (1.6 m)  Wt 68.221 kg (150 lb 6.4 oz)  BMI 26.64 kg/m2  PHYSICAL EXAM: Pt is alert and oriented, NAD HEENT: normal Neck: JVP - normal, carotids 2+= without bruits Lungs: CTA bilaterally CV: RRR without murmur or gallop Abd: soft, NT, Positive BS, no hepatomegaly Ext: no C/C/E, distal pulses intact and equal, femoral pulses 2+ without bruits Skin: warm/dry no rash  EKG:  Normal sinus rhythm 76 beats per minute, within normal limits.  ASSESSMENT AND PLAN:

## 2011-07-08 NOTE — Assessment & Plan Note (Signed)
Lipids reviewed and the patient's LDL is less than 130 milligrams per deciliter.  She has no history of coronary or peripheral vascular disease and her lipids are therefore at goal.  She will continue to work on lifestyle modification with a focus on increased exercise.

## 2011-07-18 ENCOUNTER — Ambulatory Visit: Payer: Medicare Other | Admitting: Gastroenterology

## 2011-07-25 ENCOUNTER — Ambulatory Visit: Payer: Medicare Other | Admitting: Gastroenterology

## 2011-08-25 ENCOUNTER — Ambulatory Visit (INDEPENDENT_AMBULATORY_CARE_PROVIDER_SITE_OTHER): Payer: Medicare Other | Admitting: Gastroenterology

## 2011-08-25 ENCOUNTER — Encounter: Payer: Self-pay | Admitting: Gastroenterology

## 2011-08-25 DIAGNOSIS — R1310 Dysphagia, unspecified: Secondary | ICD-10-CM

## 2011-08-25 DIAGNOSIS — Z1211 Encounter for screening for malignant neoplasm of colon: Secondary | ICD-10-CM | POA: Insufficient documentation

## 2011-08-25 NOTE — Assessment & Plan Note (Signed)
The patient has never had a colonoscopy. She  claims that she cannot take liquid prep. Possible prep will be prescribed with the caveat that she has to drink copious amounts of water.

## 2011-08-25 NOTE — Patient Instructions (Signed)
Your Endoscopy is scheduled on 09/04/2011 at 4pm You will need to call back to schedule your colonoscopy at your convenience as recommended by Dr Arlyce Dice

## 2011-08-25 NOTE — Progress Notes (Signed)
History of Present Illness: Jessica Ware is a pleasant 76 year old white female with history of of an esophageal stricture referred at the request of Dr. Debby Bud for evaluation of dysphagia. She was last dilated in 2009. Over the last couple of months she's noted increasing dysphagia to solids. She takes protonix and denies pyrosis. She has noticed some hoarseness and deepening of her voice.    Past Medical History  Diagnosis Date  . Allergic rhinitis   . Anxiety   . Depression     pt denies  . GERD (gastroesophageal reflux disease)   . Hyperlipidemia   . Hypertension   . Obesity   . History of renal stone   . Status post dilation of esophageal narrowing    Past Surgical History  Procedure Date  . Abdominal hysterectomy   . Kidney stone surgery     1985   family history includes Heart disease in her father; Heart disease (age of onset:57) in her mother; Hypertension in her mother; Kidney disease in her father; and Prostate cancer in her father. Current Outpatient Prescriptions  Medication Sig Dispense Refill  . aspirin 81 MG tablet Take 162 mg by mouth daily.       Marland Kitchen estrogens, conjugated, (PREMARIN) 0.3 MG tablet Take 1 tablet (0.3 mg total) by mouth daily. Take daily for 21 days then do not take for 7 days.  30 tablet  6  . fluticasone (FLOVENT DISKUS) 50 MCG/BLIST diskus inhaler Inhale 1 puff into the lungs 2 (two) times daily.        Marland Kitchen lisinopril-hydrochlorothiazide (PRINZIDE,ZESTORETIC) 20-25 MG per tablet TAKE 1 TABLET DAILY  30 tablet  6  . pantoprazole (PROTONIX) 40 MG tablet Take 40 mg by mouth daily.         Allergies as of 08/25/2011 - Review Complete 08/25/2011  Allergen Reaction Noted  . Penicillins  06/01/2009  . Prednisone  08/25/2011  . Sulfonamide derivatives  03/06/2007    reports that she has never smoked. She has never used smokeless tobacco. She reports that she does not drink alcohol or use illicit drugs.     Review of Systems: Pertinent positive and  negative review of systems were noted in the above HPI section. All other review of systems were otherwise negative.  Vital signs were reviewed in today's medical record Physical Exam: General: Well developed , well nourished, no acute distress Head: Normocephalic and atraumatic Eyes:  sclerae anicteric, EOMI Ears: Normal auditory acuity Mouth: No deformity or lesions Neck: Supple, no masses or thyromegaly Lungs: Clear throughout to auscultation Heart: Regular rate and rhythm; no murmurs, rubs or bruits Abdomen: Soft, non tender and non distended. No masses, hepatosplenomegaly or hernias noted. Normal Bowel sounds Rectal:deferred Musculoskeletal: Symmetrical with no gross deformities  Skin: No lesions on visible extremities Pulses:  Normal pulses noted Extremities: No clubbing, cyanosis, edema or deformities noted Neurological: Alert oriented x 4, grossly nonfocal Cervical Nodes:  No significant cervical adenopathy Inguinal Nodes: No significant inguinal adenopathy Psychological:  Alert and cooperative. Normal mood and affect

## 2011-08-25 NOTE — Assessment & Plan Note (Signed)
She likely has a recurrence of the stricture.  Recommendations #1 upper endoscopy with dilatation as indicated

## 2011-09-03 ENCOUNTER — Telehealth: Payer: Self-pay | Admitting: Gastroenterology

## 2011-09-04 ENCOUNTER — Encounter: Payer: Medicare Other | Admitting: Gastroenterology

## 2011-09-04 NOTE — Telephone Encounter (Signed)
no

## 2011-11-11 ENCOUNTER — Telehealth: Payer: Self-pay | Admitting: Gastroenterology

## 2011-11-11 NOTE — Telephone Encounter (Signed)
Pt states that her mother and father both died of a heart attack, she is trying to decide whether to have her procedure here or at the hospital. Discussed with pt that she is monitored while she is here and if there were problems she could be transported to the hospital. Pt states that she has not been diagnosed with any serious health problems. Pt states she will keep the procedure scheduled in the [facility].

## 2011-11-11 NOTE — Telephone Encounter (Signed)
Spoke with Darcey Nora RN and pt does not need to come in for pre-visit because she was instructed at office visit in March. Updated prep instructions mailed to patient.

## 2011-12-17 ENCOUNTER — Ambulatory Visit (AMBULATORY_SURGERY_CENTER): Payer: Medicare Other | Admitting: Gastroenterology

## 2011-12-17 ENCOUNTER — Encounter: Payer: Self-pay | Admitting: Gastroenterology

## 2011-12-17 VITALS — BP 144/80 | HR 86 | Temp 97.7°F | Resp 22 | Ht 63.0 in | Wt 153.0 lb

## 2011-12-17 DIAGNOSIS — K222 Esophageal obstruction: Secondary | ICD-10-CM

## 2011-12-17 DIAGNOSIS — R1310 Dysphagia, unspecified: Secondary | ICD-10-CM

## 2011-12-17 MED ORDER — SODIUM CHLORIDE 0.9 % IV SOLN: 500.0000 mL | INTRAVENOUS | Status: DC

## 2011-12-17 NOTE — Op Note (Signed)
[facility] [address] [place], [place]  [address]  ENDOSCOPY PROCEDURE REPORT  PATIENT:  Jessica, Ware  MR#:  [id number] BIRTHDATE:  10/04/1935, 75 yrs. old  GENDER:  female  ENDOSCOPIST:  Barbette Hair. Arlyce Dice, MD Referred by:  PROCEDURE DATE:  12/17/2011 PROCEDURE:  EGD, diagnostic 43235, Maloney Dilation of Esophagus ASA CLASS:  Class II INDICATIONS:  dysphagia  MEDICATIONS:   MAC sedation, administered by CRNA propofol 150mg IV, glycopyrrolate (Robinal) 0.2 mg IV, 0.6cc simethancone 0.6 cc PO TOPICAL ANESTHETIC:  DESCRIPTION OF PROCEDURE:   After the risks and benefits of the procedure were explained, informed consent was obtained.  The LB GIF-H180 [id number] endoscope was introduced through the mouth and advanced to the third portion of the duodenum.  The instrument was slowly withdrawn as the mucosa was fully examined. <<PROCEDUREIMAGES>>  A stricture was found at the gastroesophageal junction (see image1). Early stricture Dilation with maloney dilator 18mm Moderate resistance; no heme  A hiatal hernia was found. 2cm sliding hiatal hernia  Otherwise the examination was normal (see image2 and image3).    Retroflexed views revealed no abnormalities.    The scope was then withdrawn from the patient and the procedure completed.  COMPLICATIONS:  None  ENDOSCOPIC IMPRESSION: 1) Stricture at the gastroesophageal junction - s/p maloney dilitation 2) Hiatal hernia 3) Otherwise normal examination RECOMMENDATIONS: 1) dilatations PRN  ______________________________ Barbette Hair. Arlyce Dice, MD  CC:  Jacques Navy, MD  n. Rosalie DoctorBarbette Hair. Kimmerly Lora at 12/17/2011 11:27 AM  Archie Endo, [id number]

## 2011-12-17 NOTE — Progress Notes (Signed)
Patient did not have preoperative order for IV antibiotic SSI prophylaxis. (G8918)  Patient did not experience any of the following events: a burn prior to discharge; a fall within the facility; wrong site/side/patient/procedure/implant event; or a hospital transfer or hospital admission upon discharge from the facility. (G8907)  

## 2011-12-17 NOTE — Patient Instructions (Signed)
YOU HAD AN ENDOSCOPIC PROCEDURE TODAY AT THE [facility]: Refer to the procedure report that was given to you for any specific questions about what was found during the examination.  If the procedure report does not answer your questions, please call your gastroenterologist to clarify.  If you requested that your care partner not be given the details of your procedure findings, then the procedure report has been included in a sealed envelope for you to review at your convenience later.  YOU SHOULD EXPECT: Some feelings of bloating in the abdomen. Passage of more gas than usual.  Walking can help get rid of the air that was put into your GI tract during the procedure and reduce the bloating. If you had a lower endoscopy (such as a colonoscopy or flexible sigmoidoscopy) you may notice spotting of blood in your stool or on the toilet paper. If you underwent a bowel prep for your procedure, then you may not have a normal bowel movement for a few days.  DIET: Your first meal following the procedure should be a light meal and then it is ok to progress to your normal diet.  A half-sandwich or bowl of soup is an example of a good first meal.  Heavy or fried foods are harder to digest and may make you feel nauseous or bloated.  Likewise meals heavy in dairy and vegetables can cause extra gas to form and this can also increase the bloating.  Drink plenty of fluids but you should avoid alcoholic beverages for 24 hours.  ACTIVITY: Your care partner should take you home directly after the procedure.  You should plan to take it easy, moving slowly for the rest of the day.  You can resume normal activity the day after the procedure however you should NOT DRIVE or use heavy machinery for 24 hours (because of the sedation medicines used during the test).    SYMPTOMS TO REPORT IMMEDIATELY: A gastroenterologist can be reached at any hour.  During normal business hours, 8:30 AM to 5:00 PM Monday through Friday,  call [phone].  After hours and on weekends, please call the GI answering service at [phone] who will take a message and have the physician on call contact you.   Following upper endoscopy (EGD)  Vomiting of blood or coffee ground material  New chest pain or pain under the shoulder blades  Painful or persistently difficult swallowing  New shortness of breath  Fever of 100F or higher  Black, tarry-looking stools  FOLLOW UP: If any biopsies were taken you will be contacted by phone or by letter within the next 1-3 weeks.  Call your gastroenterologist if you have not heard about the biopsies in 3 weeks.  Our staff will call the home number listed on your records the next business day following your procedure to check on you and address any questions or concerns that you may have at that time regarding the information given to you following your procedure. This is a courtesy call and so if there is no answer at the home number and we have not heard from you through the emergency physician on call, we will assume that you have returned to your regular daily activities without incident.    FOLLOW DILATATION DIET GIVEN TO YOU TODAY   INFORMATION ON HIATAL HERNIA GIVEN TO YOU TODAY  SIGNATURES/CONFIDENTIALITY: You and/or your care partner have signed paperwork which will be entered into your electronic medical record.  These signatures attest to the  fact that that the information above on your After Visit Summary has been reviewed and is understood.  Full responsibility of the confidentiality of this discharge information lies with you and/or your care-partner.

## 2011-12-18 ENCOUNTER — Telehealth: Payer: Self-pay | Admitting: *Deleted

## 2011-12-18 NOTE — Telephone Encounter (Signed)
  Follow up Call-  Call back number 12/17/2011  Post procedure Call Back phone  # [phone]  Permission to leave phone message Yes     Patient questions:  Do you have a fever, pain , or abdominal swelling? no Pain Score  0 *  Have you tolerated food without any problems? yes  Have you been able to return to your normal activities? yes  Do you have any questions about your discharge instructions: Diet   no Medications  no Follow up visit  no  Do you have questions or concerns about your Care? no  Actions: * If pain score is 4 or above: No action needed, pain <4.

## 2011-12-30 ENCOUNTER — Telehealth: Payer: Self-pay | Admitting: Gastroenterology

## 2011-12-30 NOTE — Telephone Encounter (Signed)
Pt had EGD 12/17/11. States that after her EGD on the way home she felt that her tongue was twice its normal size. Pt also states her breathing has not been right, she has had some SOB. Discussed with pt that this sounds like something that should be addressed by her PCP or a pulmonologist. Pt states she does not want to see anyone else until she sees Dr. Arlyce Dice to make sure this is not related to the EGD. Pt scheduled to see Dr. Arlyce Dice 01/05/12@2 :30pm. Pt aware of appt date and time.

## 2012-01-05 ENCOUNTER — Other Ambulatory Visit: Payer: Self-pay | Admitting: Cardiovascular Disease

## 2012-01-05 ENCOUNTER — Encounter: Payer: Self-pay | Admitting: Gastroenterology

## 2012-01-05 ENCOUNTER — Ambulatory Visit (INDEPENDENT_AMBULATORY_CARE_PROVIDER_SITE_OTHER): Payer: Medicare Other | Admitting: Gastroenterology

## 2012-01-05 VITALS — BP 126/70 | HR 92 | Ht 63.0 in | Wt 154.5 lb

## 2012-01-05 DIAGNOSIS — K222 Esophageal obstruction: Secondary | ICD-10-CM | POA: Insufficient documentation

## 2012-01-05 DIAGNOSIS — F411 Generalized anxiety disorder: Secondary | ICD-10-CM

## 2012-01-05 DIAGNOSIS — Z1211 Encounter for screening for malignant neoplasm of colon: Secondary | ICD-10-CM

## 2012-01-05 MED ORDER — LISINOPRIL-HYDROCHLOROTHIAZIDE 20-25 MG PO TABS: 1.0000 | ORAL_TABLET | Freq: Every day | ORAL | Status: DC

## 2012-01-05 NOTE — Telephone Encounter (Signed)
..   Requested Prescriptions   Pending Prescriptions Disp Refills  . lisinopril-hydrochlorothiazide (PRINZIDE,ZESTORETIC) 20-25 MG per tablet 90 tablet 3    Sig: Take 1 tablet by mouth daily.

## 2012-01-05 NOTE — Assessment & Plan Note (Signed)
Repeat dilatation as needed 

## 2012-01-05 NOTE — Patient Instructions (Addendum)
Follow up as needed

## 2012-01-05 NOTE — Assessment & Plan Note (Signed)
Screening colonoscopy was again recommended to the patient

## 2012-01-05 NOTE — Progress Notes (Signed)
History of Present Illness:  Patient has returned following upper endoscopy with dilatation of a distal esophageal stricture. Dysphagia has entirely resolved. Her concern is a post procedure reaction consisting of a sensation of a swollen tongue. For the next 2 days she had some shortness of breath and was noted to have high blood pressure by the patient's account. Symptoms slowly subsided. She is concerned that she had a reaction to the procedure or medications.    Review of Systems: Pertinent positive and negative review of systems were noted in the above HPI section. All other review of systems were otherwise negative.    Current Medications, Allergies, Past Medical History, Past Surgical History, Family History and Social History were reviewed in [employer] electronic medical record  Vital signs were reviewed in today's medical record. Physical Exam: General: Well developed , well nourished, no acute distress logical:  Alert and cooperative. Normal mood and affect

## 2012-01-05 NOTE — Assessment & Plan Note (Addendum)
I suspect the patient's complaints post procedure were related to anxiety. Drug allergy is much less likely.  I assured the patient that she'll receive conscious sedation at her next endoscopic procedure rather than propofol since she seemed to tolerate conscious sedation better than propofol in the past.

## 2012-07-01 ENCOUNTER — Telehealth: Payer: Self-pay | Admitting: Cardiovascular Disease

## 2012-07-01 NOTE — Telephone Encounter (Signed)
Pt having problem with BP and wants to see cooper asap, pls call

## 2012-07-01 NOTE — Telephone Encounter (Signed)
I spoke with the pt and she is having issues with her BP spiking in the morning.  The pt is following with a new PCP Dr Brock Bad in [facility] due to the convenience of [place] location. The pt's SBP in the morning is 150-160. The PCP started the pt on Metoprolol Succinate 50mg  at bedtime two weeks ago.  Throughout the day the BP is okay. The pt had a sleep study performed at home and was referred to a sleep specialist for sleep apnea. I made the pt aware that she should continue to follow-up with her PCP for BP management. The pt agreed with plan.  The pt is due to see Dr Excell Seltzer for her yearly follow-up and I have scheduled an appointment on 07/20/12.

## 2012-07-20 ENCOUNTER — Ambulatory Visit (INDEPENDENT_AMBULATORY_CARE_PROVIDER_SITE_OTHER): Payer: Medicare Other | Admitting: Cardiovascular Disease

## 2012-07-20 ENCOUNTER — Encounter: Payer: Self-pay | Admitting: Cardiovascular Disease

## 2012-07-20 VITALS — BP 143/68 | HR 72 | Ht 63.0 in | Wt 156.8 lb

## 2012-07-20 DIAGNOSIS — E785 Hyperlipidemia, unspecified: Secondary | ICD-10-CM

## 2012-07-20 MED ORDER — AMLODIPINE BESYLATE 10 MG PO TABS: 10.0000 mg | ORAL_TABLET | Freq: Every day | ORAL | Status: DC

## 2012-07-20 MED ORDER — AMLODIPINE BESYLATE 5 MG PO TABS: 5.0000 mg | ORAL_TABLET | Freq: Every day | ORAL | Status: DC

## 2012-07-20 NOTE — Patient Instructions (Addendum)
Your physician has recommended you make the following change in your medication: INCREASE Amlodipine 5mg  take one by mouth daily, DECREASE Metoprolol Succinate to one-half tablet daily for 1 WEEK and then STOP medication  Your physician recommends that you have lab work today: BNP  Your physician wants you to follow-up in: 1 YEAR with Dr Excell Seltzer.  You will receive a reminder letter in the mail two months in advance. If you don't receive a letter, please call our office to schedule the follow-up appointment.

## 2012-07-20 NOTE — Progress Notes (Signed)
HPI:  77 year-old woman returns for followup evaluation. She is followed for hypertension hyperlipidemia. The patient's most recent lipid profile was approximately one year ago and her cholesterol was 202, trig was read 138, HDL 66, and LDL 112. She's on combination of amlodipine, lisinopril, hydrochlorothiazide, and Toprol-XL for blood pressure treatment. She brings in lab work today from her primary physician showing a cholesterol of 197, trig was read 97, HDL 73, and LDL 105.  The patient has multiple concerns. She started long-acting metoprolol a few months ago and has not wished to stay on this medicine. She has read about side effects and she complains of fatigue and malaise. She started this because she was having some blood pressure spikes in the morning. She denies chest pain or dyspnea. She feels like she is retaining water and is very concerned about her weight. She's complained of some leg swelling and abdominal distention. She denies orthopnea or PND.  Outpatient Encounter Prescriptions as of 07/20/2012  Medication Sig Dispense Refill  . amLODipine (NORVASC) 5 MG tablet Take 2.5 mg by mouth daily.       Marland Kitchen aspirin 81 MG tablet Take 162 mg by mouth daily.       . Cholecalciferol (VITAMIN D3) 1000 UNITS CAPS Take 1 Units by mouth daily.      Marland Kitchen estrogens, conjugated, (PREMARIN) 0.3 MG tablet Take 4.5 mg by mouth daily. 1/2 tablet daily      . lisinopril-hydrochlorothiazide (PRINZIDE,ZESTORETIC) 20-25 MG per tablet Take 1 tablet by mouth daily.  90 tablet  3  . metoprolol succinate (TOPROL-XL) 50 MG 24 hr tablet Take 50 mg by mouth daily. Take with or immediately following a meal.      . omeprazole (PRILOSEC) 20 MG capsule Take 20 mg by mouth daily.      Marland Kitchen co-enzyme Q-10 30 MG capsule Take 30 mg by mouth daily.      . fluticasone (FLOVENT DISKUS) 50 MCG/BLIST diskus inhaler Inhale 1 puff into the lungs 2 (two) times daily.        . pantoprazole (PROTONIX) 40 MG tablet Take 40 mg by mouth  daily.          Allergies  Allergen Reactions  . Penicillins   . Prednisone   . Sulfonamide Derivatives     Past Medical History  Diagnosis Date  . Allergic rhinitis   . Anxiety   . Depression     pt denies  . GERD (gastroesophageal reflux disease)   . Hyperlipidemia   . Hypertension   . Obesity   . History of renal stone   . Status post dilation of esophageal narrowing    ROS: Negative except as per HPI  BP 143/68  Pulse 72  Ht 5\' 3"  (1.6 m)  Wt 71.124 kg (156 lb 12.8 oz)  BMI 27.78 kg/m2  PHYSICAL EXAM: Pt is alert and oriented, NAD HEENT: normal Neck: JVP - normal, carotids 2+= without bruits Lungs: CTA bilaterally CV: RRR without murmur or gallop Abd: soft, NT, Positive BS, no hepatomegaly Ext: no C/C/E, distal pulses intact and equal Skin: warm/dry no rash  EKG:  Sinus rhythm with occasional PVCs, heart rate 72 beats per minute, otherwise within normal limits.  ASSESSMENT AND PLAN: 1. Essential hypertension. The patient's blood pressure is reasonably controlled on her current medications. Since she will would like to discontinue metoprolol, I would recommend increasing amlodipine 5 mg daily. She is currently taking 2.5 mg daily. She understands there may be a little  bit of edema hasn't side effect, but she continues on lisinopril and hydrochlorothiazide as well. I asked her to take metoprolol succinate 25 mg for one week then stop. She will continue to monitor her blood pressure.  2. Subjective edema. I don't appreciate any evidence of volume overload on exam. Will check a BNP, otherwise plan reassurance unless the BNP is abnormal.   For followup I would like to see her back in 12 months.  Tonny Bollman 07/20/2012 3:51 PM

## 2012-07-21 LAB — BRAIN NATRIURETIC PEPTIDE: Pro B Natriuretic peptide (BNP): 62 pg/mL (ref 0.0–100.0)

## 2012-11-23 ENCOUNTER — Telehealth: Payer: Self-pay | Admitting: Cardiovascular Disease

## 2012-11-23 NOTE — Telephone Encounter (Signed)
New Prob      Pt is experiencing some swelling in her feet and ankles and would like to speak to nurse.

## 2012-11-23 NOTE — Telephone Encounter (Signed)
I spoke with the pt and she is complaining of swelling in her feet and ankles for the past month.  The pt did stop her Norvasc about 6 weeks ago due to multiple symptoms. The pt said her BP has been okay since making this change.  The pt does wear compression hose and this helps her swelling.  The pt also has varicose veins.  I made the pt aware that her symptoms are most likely related to venous insufficiency.  At this time the pt will watch salt intake, elevate legs and wear compression hose. The pt also commented that her urine has a foul smell.  I instructed her to contact her PCP for further evaluation of urinary symptoms.

## 2013-01-17 ENCOUNTER — Telehealth: Payer: Self-pay | Admitting: Cardiovascular Disease

## 2013-01-17 DIAGNOSIS — I10 Essential (primary) hypertension: Secondary | ICD-10-CM

## 2013-01-17 NOTE — Telephone Encounter (Signed)
New Prob    Pt states her BP has been ranging from 90-101 (diastolic) 170 (systolic) for the last few days. Would like to speak to nurse regarding this.

## 2013-01-17 NOTE — Telephone Encounter (Signed)
I spoke with the pt and she was [place] and decided to check her BP.  The pt's BP was elevated so she doubled up on her lisinopril HCT and restarted amlodipine 2.5mg  over the weekend.  BP readings: 01/16/13 5pm 177/91   7pm 175/82, pulse 82 01/17/13 7am 161/89, 74              10am 139/83, 100              4 pm 171/83, 81  The pt does not take her medications until 5 PM. Today the pt has taken Lisinopril HCT 20/25mg  and Amlodipine 2.5mg .  At this time the pt will continue to monitor her BP and I will touch base with her tomorrow to see what her BP is running. If BP remains elevated then the pt can increase Amlodipine to 5mg  daily.

## 2013-01-18 NOTE — Telephone Encounter (Signed)
I spoke with the pt and before the pt went to bed last night she went ahead and took and additional Amlodipine 2.5mg  daily.  BP Readings: 8 AM 149/88, pulse 87 Mid morning pt took Amlodipine 2.5mg  and Lisinopril HCT 20/25mg   3 PM 120/74, 97 5 PM 125/77, 90  I made the pt aware that she will most likely need to restart Metoprolol due to her heart rate.  At this time the pt does not want to take another medication.  The pt would like to have and renal duplex because she wants to make sure that her BP issues are not related to "kidney problems". I will forward this message to Dr Excell Seltzer to review and make further recommendations.

## 2013-01-19 NOTE — Telephone Encounter (Signed)
Fine to check renal duplex since she is on multiple drugs. Would increase amlodipine to 5 mg. She couldn't take metoprolol because of side effects in the past

## 2013-01-19 NOTE — Telephone Encounter (Signed)
I spoke with the pt and she said her BP has improved at this time. The pt has already increased Amlodipine to 5mg  daily. Order placed for renal duplex to be performed.

## 2013-01-28 ENCOUNTER — Encounter (INDEPENDENT_AMBULATORY_CARE_PROVIDER_SITE_OTHER): Payer: Medicare Other

## 2013-01-28 DIAGNOSIS — I10 Essential (primary) hypertension: Secondary | ICD-10-CM

## 2013-02-02 ENCOUNTER — Encounter: Payer: Self-pay | Admitting: Cardiovascular Disease

## 2013-02-02 NOTE — Telephone Encounter (Signed)
New Prob  Pt is expecting a call back about a previous test.

## 2013-02-02 NOTE — Telephone Encounter (Signed)
This encounter was created in error - please disregard.

## 2013-04-13 ENCOUNTER — Telehealth: Payer: Self-pay | Admitting: Cardiovascular Disease

## 2013-04-13 NOTE — Telephone Encounter (Signed)
New problem:  Pt states she doesn't recall ever getting her PV results from August. Pt would like to know how that test went. Please advise

## 2013-04-13 NOTE — Telephone Encounter (Signed)
PT  AWARE  OF  RENAL  DUPLEX  RESULTS  .Jessica Ware

## 2013-06-06 ENCOUNTER — Telehealth: Payer: Self-pay | Admitting: Cardiovascular Disease

## 2013-06-06 DIAGNOSIS — E785 Hyperlipidemia, unspecified: Secondary | ICD-10-CM

## 2013-06-06 NOTE — Telephone Encounter (Signed)
New Problem:  Pt is wanting to have labs drawn prior to her appt in Jan. Pt states she does not want to be scheduled for a certain day. Pt wants to come  at her convenience. There are no orders in Epic. I asked pt if she was wanting her cholesterol and liver checked and she said she wanted a "full blood work up."  Pt would like to be notified when the orders are in Epic. Pt is requesting a call back.

## 2013-06-06 NOTE — Telephone Encounter (Signed)
Pt has a yearly O/V on January 21/2015 Pt would like to have labs prior the visit. She states Dr. Excell Seltzer can't see her without the lab, because MD would not know if something is wrong with her with out the labs results. Pt is aware that the message will be send to MD and his nurse for reviewing.

## 2013-06-07 NOTE — Telephone Encounter (Signed)
Would check CMET, lipid panel, CBC, TSH prior to her office visit. thx

## 2013-06-07 NOTE — Telephone Encounter (Signed)
I left a message on the pt's voicemail that she needs to have fasting lab work drawn one week prior to her appointment with Dr Excell Seltzer in January 2015. Lab orders placed in the system.

## 2013-06-15 ENCOUNTER — Telehealth: Payer: Self-pay | Admitting: Cardiovascular Disease

## 2013-06-15 NOTE — Telephone Encounter (Signed)
I spoke with the pt and she has been taking Ibuprofen on a daily basis and has been using it as needed. I made the pt aware that Ibuprofen can cause salt and fluid retention. This can in turn cause her BP to become elevated. The pt said she is having to take Ibuprofen for her arthritis. I made the pt aware that she should try using this medication as needed. The pt is also taking amlodipine.  In reviewing the pt's January 2014 note the pt's weight was 156 lbs.  At this time the pt may consider having a cortisone injection in her knee to help with arthritis symptoms. The pt is scheduled to have labs and see Dr Excell Seltzer in January 2015.

## 2013-06-15 NOTE — Telephone Encounter (Signed)
New Problem:  Pt is c/o her BP being 159/83 this morning.. 136/82 last night. Pt states she has been taking ibprofen and she read where it can elevate one's BP... Pt has questions about that information...  Pt is also c/o of wt gain and a decrease in urine. Pt states her wt was 148 and now it is around 160. Pt states she has maintained a wt of 148 for years and a couple weeks ago she weighed herself and she was 160. Pt is not sure what happened.   Pt is requesting a call back today and states she will be out tomorrow and unavailable

## 2013-07-13 ENCOUNTER — Other Ambulatory Visit: Payer: Medicare Other

## 2013-07-18 ENCOUNTER — Other Ambulatory Visit (INDEPENDENT_AMBULATORY_CARE_PROVIDER_SITE_OTHER): Payer: Medicare HMO

## 2013-07-18 DIAGNOSIS — E785 Hyperlipidemia, unspecified: Secondary | ICD-10-CM

## 2013-07-18 LAB — LIPID PANEL
CHOLESTEROL: 186 mg/dL (ref 0–200)
HDL: 63.9 mg/dL (ref >39.00)
LDL CALC: 103 mg/dL — AB (ref 0–99)
TRIGLYCERIDES: 96 mg/dL (ref 0.0–149.0)
Total CHOL/HDL Ratio: 3
VLDL: 19.2 mg/dL (ref 0.0–40.0)

## 2013-07-18 LAB — BASIC METABOLIC PANEL
BUN: 20 mg/dL (ref 6–23)
CALCIUM: 9.3 mg/dL (ref 8.4–10.5)
CO2: 29 mEq/L (ref 19–32)
CREATININE: 0.8 mg/dL (ref 0.4–1.2)
Chloride: 101 mEq/L (ref 96–112)
GFR: 73.18 mL/min (ref >60.00)
GLUCOSE: 86 mg/dL (ref 70–99)
POTASSIUM: 4.7 meq/L (ref 3.5–5.1)
Sodium: 140 mEq/L (ref 135–145)

## 2013-07-18 LAB — HEPATIC FUNCTION PANEL
ALBUMIN: 3.4 g/dL — AB (ref 3.5–5.2)
ALT: 14 U/L (ref 0–35)
AST: 22 U/L (ref 0–37)
Alkaline Phosphatase: 98 U/L (ref 39–117)
BILIRUBIN TOTAL: 0.9 mg/dL (ref 0.3–1.2)
Bilirubin, Direct: 0.2 mg/dL (ref 0.0–0.3)
Total Protein: 6.7 g/dL (ref 6.0–8.3)

## 2013-07-18 LAB — CBC WITH DIFFERENTIAL/PLATELET
BASOS ABS: 0 10*3/uL (ref 0.0–0.1)
Basophils Relative: 0.7 % (ref 0.0–3.0)
Eosinophils Absolute: 0.1 10*3/uL (ref 0.0–0.7)
Eosinophils Relative: 1.5 % (ref 0.0–5.0)
HEMATOCRIT: 40.3 % (ref 36.0–46.0)
HEMOGLOBIN: 13.8 g/dL (ref 12.0–15.0)
LYMPHS ABS: 1.9 10*3/uL (ref 0.7–4.0)
Lymphocytes Relative: 29.8 % (ref 12.0–46.0)
MCHC: 34.2 g/dL (ref 30.0–36.0)
MCV: 97.1 fl (ref 78.0–100.0)
MONOS PCT: 11 % (ref 3.0–12.0)
Monocytes Absolute: 0.7 10*3/uL (ref 0.1–1.0)
Neutro Abs: 3.6 10*3/uL (ref 1.4–7.7)
Neutrophils Relative %: 57 % (ref 43.0–77.0)
Platelets: 282 10*3/uL (ref 150.0–400.0)
RBC: 4.15 Mil/uL (ref 3.87–5.11)
RDW: 12.7 % (ref 11.5–14.6)
WBC: 6.4 10*3/uL (ref 4.5–10.5)

## 2013-07-18 LAB — TSH: TSH: 2.31 u[IU]/mL (ref 0.35–5.50)

## 2013-07-20 ENCOUNTER — Ambulatory Visit (INDEPENDENT_AMBULATORY_CARE_PROVIDER_SITE_OTHER): Payer: Medicare HMO | Admitting: Cardiovascular Disease

## 2013-07-20 ENCOUNTER — Encounter: Payer: Self-pay | Admitting: Cardiovascular Disease

## 2013-07-20 VITALS — BP 120/80 | HR 77 | Ht 63.0 in | Wt 159.4 lb

## 2013-07-20 DIAGNOSIS — E785 Hyperlipidemia, unspecified: Secondary | ICD-10-CM

## 2013-07-20 DIAGNOSIS — I10 Essential (primary) hypertension: Secondary | ICD-10-CM

## 2013-07-20 NOTE — Patient Instructions (Signed)
Your physician recommends that you continue on your current medications as directed. Please refer to the Current Medication list given to you today.  Your physician wants you to follow-up in: 1 year with Dr. Cooper.  You will receive a reminder letter in the mail two months in advance. If you don't receive a letter, please call our office to schedule the follow-up appointment.   

## 2013-07-20 NOTE — Progress Notes (Signed)
    HPI:  78 year old woman presenting for followup evaluation. She is followed for hypertension and hyperlipidemia. Blood pressure has been well controlled after changes in her medications over the past year. Her beta blocker was discontinued because of fatigue. Amlodipine was doubled from 2.5 up to 5 mg daily. She has been treated with lisinopril and hydrochlorothiazide. Home blood pressures range around 125-135 over 80s. She remains compliant with pravastatin. She has not engaged in routine exercise.  She's had a recent bout with influenza. She is recovering now. She still has a cough and some pleuritic discomfort. No substernal chest pain. No exertional symptoms. Complains of mild leg swelling which is a chronic issue.  Outpatient Encounter Prescriptions as of 07/20/2013  Medication Sig  . amLODipine (NORVASC) 5 MG tablet Take 1 tablet (5 mg total) by mouth daily.  Marland Kitchen. aspirin 81 MG tablet Take 162 mg by mouth daily.   . Cholecalciferol (VITAMIN D3) 1000 UNITS CAPS Take 1 Units by mouth daily.  Marland Kitchen. co-enzyme Q-10 30 MG capsule Take 30 mg by mouth daily.  . fluticasone (FLOVENT DISKUS) 50 MCG/BLIST diskus inhaler Inhale 1 puff into the lungs 2 (two) times daily.    Marland Kitchen. lisinopril-hydrochlorothiazide (PRINZIDE,ZESTORETIC) 20-25 MG per tablet Take 1 tablet by mouth daily.  Marland Kitchen. omeprazole (PRILOSEC) 20 MG capsule Take 20 mg by mouth daily.  . pantoprazole (PROTONIX) 40 MG tablet Take 40 mg by mouth daily.    . pravastatin (PRAVACHOL) 20 MG tablet Take 1 tablet (20 mg total) by mouth every evening.  . estrogens, conjugated, (PREMARIN) 0.3 MG tablet Take 4.5 mg by mouth daily. 1/2 tablet daily    Allergies  Allergen Reactions  . Penicillins   . Prednisone   . Sulfonamide Derivatives     Past Medical History  Diagnosis Date  . Allergic rhinitis   . Anxiety   . Depression     pt denies  . GERD (gastroesophageal reflux disease)   . Hyperlipidemia   . Hypertension   . Obesity   . History of  renal stone   . Status post dilation of esophageal narrowing     ROS: Negative except as per HPI  BP 120/80  Pulse 77  Ht 5\' 3"  (1.6 m)  Wt 159 lb 6.4 oz (72.303 kg)  BMI 28.24 kg/m2  PHYSICAL EXAM: Pt is alert and oriented, NAD HEENT: normal Neck: JVP - normal, carotids 2+= without bruits Lungs: CTA bilaterally CV: RRR without murmur or gallop Abd: soft, NT, Positive BS, no hepatomegaly Ext: no C/C/E, distal pulses intact and equal Skin: warm/dry no rash  EKG:  Normal sinus rhythm 77 beats per minute, within normal limits.  ASSESSMENT AND PLAN: 1. Essential hypertension. Blood pressure is well controlled. Renal arteries were evaluated last year with a duplex scan and these were and found to be normal. She will continue on her current medical program. Recent laboratory data was reviewed.  2. Hyperlipidemia. Her lipid panel is excellent. She will continue on low-dose pravastatin.  For followup I will see her back in one year. We had extensive discussion about the importance of regular exercise, specifically as it pertains to future blood pressure control. She will aim to lose weight.  Tonny BollmanMichael Daquan Crapps 07/20/2013 4:38 PM

## 2014-01-11 ENCOUNTER — Other Ambulatory Visit: Payer: Self-pay | Admitting: Cardiovascular Disease

## 2014-03-17 ENCOUNTER — Other Ambulatory Visit: Payer: Self-pay | Admitting: Cardiovascular Disease

## 2014-06-15 ENCOUNTER — Other Ambulatory Visit: Payer: Self-pay | Admitting: Cardiovascular Disease

## 2014-07-12 ENCOUNTER — Other Ambulatory Visit: Payer: Self-pay | Admitting: Cardiovascular Disease

## 2014-07-24 ENCOUNTER — Telehealth: Payer: Self-pay | Admitting: Cardiovascular Disease

## 2014-07-24 DIAGNOSIS — I10 Essential (primary) hypertension: Secondary | ICD-10-CM

## 2014-07-24 DIAGNOSIS — E785 Hyperlipidemia, unspecified: Secondary | ICD-10-CM

## 2014-07-24 NOTE — Telephone Encounter (Signed)
Lmtcb.  Reviewed office note from last year and do not see request for lab work.

## 2014-07-24 NOTE — Telephone Encounter (Signed)
Pt rs recll appt but thinks she needs lab work in order for dr cooper to see her, no order, if she needs labs can order be put in and let pt know what time/day to be here

## 2014-07-25 NOTE — Telephone Encounter (Signed)
I reviewed the pt's chart and she last had labs drawn in January 2015 (BMP, Lipid, LIVER, TSH and CBC).  I will forward this message to Dr Excell Seltzerooper to review and determine if these labs are okay to order.

## 2014-07-26 ENCOUNTER — Ambulatory Visit: Payer: Medicare HMO | Admitting: Cardiovascular Disease

## 2014-07-26 NOTE — Telephone Encounter (Signed)
I spoke with the pt and lab appointment scheduled.

## 2014-07-26 NOTE — Telephone Encounter (Signed)
Please repeat same labs this year. thx

## 2014-10-14 ENCOUNTER — Other Ambulatory Visit: Payer: Self-pay | Admitting: Cardiovascular Disease

## 2014-10-19 ENCOUNTER — Other Ambulatory Visit (INDEPENDENT_AMBULATORY_CARE_PROVIDER_SITE_OTHER): Payer: Medicare HMO | Admitting: *Deleted

## 2014-10-19 DIAGNOSIS — I10 Essential (primary) hypertension: Secondary | ICD-10-CM

## 2014-10-19 DIAGNOSIS — E785 Hyperlipidemia, unspecified: Secondary | ICD-10-CM

## 2014-10-19 LAB — LIPID PANEL
CHOLESTEROL: 158 mg/dL (ref 0–200)
HDL: 51.2 mg/dL (ref >39.00)
LDL CALC: 91 mg/dL (ref 0–99)
NonHDL: 106.8
Total CHOL/HDL Ratio: 3
Triglycerides: 78 mg/dL (ref 0.0–149.0)
VLDL: 15.6 mg/dL (ref 0.0–40.0)

## 2014-10-19 LAB — BASIC METABOLIC PANEL
BUN: 18 mg/dL (ref 6–23)
CO2: 33 mEq/L — ABNORMAL HIGH (ref 19–32)
CREATININE: 0.85 mg/dL (ref 0.40–1.20)
Calcium: 9.7 mg/dL (ref 8.4–10.5)
Chloride: 100 mEq/L (ref 96–112)
GFR: 68.99 mL/min (ref >60.00)
Glucose, Bld: 96 mg/dL (ref 70–99)
Potassium: 3.6 mEq/L (ref 3.5–5.1)
SODIUM: 136 meq/L (ref 135–145)

## 2014-10-19 LAB — CBC
HCT: 39.9 % (ref 36.0–46.0)
Hemoglobin: 13.6 g/dL (ref 12.0–15.0)
MCHC: 34.2 g/dL (ref 30.0–36.0)
MCV: 96.2 fl (ref 78.0–100.0)
Platelets: 223 10*3/uL (ref 150.0–400.0)
RBC: 4.14 Mil/uL (ref 3.87–5.11)
RDW: 13 % (ref 11.5–15.5)
WBC: 5.9 10*3/uL (ref 4.0–10.5)

## 2014-10-19 LAB — HEPATIC FUNCTION PANEL
ALT: 13 U/L (ref 0–35)
AST: 21 U/L (ref 0–37)
Albumin: 3.6 g/dL (ref 3.5–5.2)
Alkaline Phosphatase: 121 U/L — ABNORMAL HIGH (ref 39–117)
BILIRUBIN TOTAL: 0.9 mg/dL (ref 0.2–1.2)
Bilirubin, Direct: 0.2 mg/dL (ref 0.0–0.3)
Total Protein: 6.7 g/dL (ref 6.0–8.3)

## 2014-10-19 LAB — TSH: TSH: 2.69 u[IU]/mL (ref 0.35–4.50)

## 2014-10-19 LAB — MAGNESIUM: Magnesium: 1.8 mg/dL (ref 1.5–2.5)

## 2014-10-19 NOTE — Addendum Note (Signed)
Addended by: Jhoel Stieg K on: 10/19/2014 09:59 AM   Modules accepted: Orders  

## 2014-10-19 NOTE — Addendum Note (Signed)
Addended by: Tonita PhoenixBOWDEN, Eban Weick K on: 10/19/2014 09:59 AM   Modules accepted: Orders

## 2014-10-25 ENCOUNTER — Encounter: Payer: Self-pay | Admitting: Cardiovascular Disease

## 2014-10-25 ENCOUNTER — Ambulatory Visit (INDEPENDENT_AMBULATORY_CARE_PROVIDER_SITE_OTHER): Payer: Medicare HMO | Admitting: Cardiovascular Disease

## 2014-10-25 VITALS — BP 138/82 | HR 73 | Ht 63.0 in | Wt 159.8 lb

## 2014-10-25 DIAGNOSIS — E785 Hyperlipidemia, unspecified: Secondary | ICD-10-CM

## 2014-10-25 DIAGNOSIS — I10 Essential (primary) hypertension: Secondary | ICD-10-CM | POA: Diagnosis not present

## 2014-10-25 NOTE — Patient Instructions (Signed)
Medication Instructions:  Your physician has recommended you make the following change in your medication:  1. STOP Pravastatin  Labwork: Your physician recommends that you return for a FASTING lipid profile in 3 MONTHS--nothing to eat or drink after midnight, lab opens at 7:30 AM (January 25, 2015)  Testing/Procedures: No new orders.   Follow-Up: Your physician wants you to follow-up in: 1 YEAR with Dr Excell Seltzerooper.  You will receive a reminder letter in the mail two months in advance. If you don't receive a letter, please call our office to schedule the follow-up appointment.   Any Other Special Instructions Will Be Listed Below (If Applicable).

## 2014-10-25 NOTE — Progress Notes (Signed)
Cardiology Office Note   Date:  10/25/2014   ID:  Jessica Ware, DOB [birth year], MRN [id number]  PCP:  Corinna CapraBROWN,ANGEL, DO  Cardiologist:  Tonny BollmanMichael Aymara Sassi, MD    Chief Complaint  Patient presents with  . Hypertension    flushing and joint pain    History of Present Illness: Jessica Ware is a 79 y.o. female who presents for follow-up of hypertension and hyperlipidemia. She's had significant problems with myalgias and joint pains since her visit here one year ago. She did some reading and stopped omeprazole and pravastatin. This helped her feel better. She has had return of reflux symptoms. She has not resumed pravastatin.   She's otherwise doing ok. No CP, dyspnea, or edema. Trying to eat healthy. She's active but no regular exercise.   Past Medical History  Diagnosis Date  . Allergic rhinitis   . Anxiety   . Depression     pt denies  . GERD (gastroesophageal reflux disease)   . Hyperlipidemia   . Hypertension   . Obesity   . History of renal stone   . Status post dilation of esophageal narrowing     Past Surgical History  Procedure Laterality Date  . Abdominal hysterectomy    . Kidney stone surgery      1985    Current Outpatient Prescriptions  Medication Sig Dispense Refill  . amLODipine (NORVASC) 2.5 MG tablet Take 2.5 mg by mouth daily.    Marland Kitchen. aspirin 81 MG tablet Take 162 mg by mouth daily.     . Coenzyme Q-10 100 MG capsule Take 100 mg by mouth daily.    Marland Kitchen. lisinopril-hydrochlorothiazide (PRINZIDE,ZESTORETIC) 20-25 MG per tablet Take 1 tablet by mouth daily. 90 tablet 3  . pravastatin (PRAVACHOL) 20 MG tablet TAKE 1 TABLET AT BEDTIME. 90 tablet 1  . VITAMIN D, CHOLECALCIFEROL, PO Take 5,000 Units by mouth daily.     No current facility-administered medications for this visit.   Allergies:   Prednisone; Sulfonamide derivatives; and Penicillins   Social History:  The patient  reports that she has never smoked. She has never used smokeless tobacco.  She reports that she does not drink alcohol or use illicit drugs.   Family History:  The patient's family history includes Heart disease in her father; Heart disease (age of onset: 3757) in her mother; Hypertension in her mother; Kidney disease in her father; Prostate cancer in her father.   ROS:  Please see the history of present illness.  Otherwise, review of systems is positive for weight gain, hip pain, muscle pain, flushing, snoring, constipation, anxiety.  All other systems are reviewed and negative.   PHYSICAL EXAM: VS:  BP 138/82 mmHg  Pulse 73  Ht 5\' 3"  (1.6 m)  Wt 159 lb 12.8 oz (72.485 kg)  BMI 28.31 kg/m2 , BMI Body mass index is 28.31 kg/(m^2). GEN: Well nourished, well developed, in no acute distress HEENT: normal Neck: no JVD, no masses. No carotid bruits Cardiac: RRR without murmur or gallop                Respiratory:  clear to auscultation bilaterally, normal work of breathing GI: soft, nontender, nondistended, + BS MS: no deformity or atrophy Ext: no pretibial edema, pedal pulses 2+= bilaterally Skin: There are small spider veins and distal legs around the ankles Neuro:  Strength and sensation are intact Psych: euthymic mood, full affect  EKG:  EKG is ordered today. The ekg ordered today shows normal sinus rhythm 70  bpm, within normal limits.  Recent Labs: 10/19/2014: ALT 13; BUN 18; Creatinine 0.85; Hemoglobin 13.6; Magnesium 1.8; Platelets 223.0; Potassium 3.6; Sodium 136; TSH 2.69   Lipid Panel     Component Value Date/Time   CHOL 158 10/19/2014 0938   TRIG 78.0 10/19/2014 0938   HDL 51.20 10/19/2014 0938   CHOLHDL 3 10/19/2014 0938   VLDL 15.6 10/19/2014 0938   LDLCALC 91 10/19/2014 0938   LDLDIRECT 112.1 07/03/2011 1046      Wt Readings from Last 3 Encounters:  10/25/14 159 lb 12.8 oz (72.485 kg)  07/20/13 159 lb 6.4 oz (72.303 kg)  07/20/12 156 lb 12.8 oz (71.124 kg)     ASSESSMENT AND PLAN: 1.  Essential hypertension: Blood pressure is well  controlled on current medical therapy. Recent lab work was reviewed and is within normal limits.  2. Hyperlipidemia: Lipids at goal as above. However, she is now off of pravastatin. This was discontinued only 2 weeks before her lab work, so I do not think her lipid panel will reflect her cholesterol level as she is out a little further from discontinuation of pravastatin. Will repeat a lipid panel in 3 months so that we can consider non-statin alternatives. Also spent time discussing the importance of exercise, dietary changes, and lifestyle modification.  Current medicines are reviewed with the patient today.  The patient does not have concerns regarding medicines.  Labs/ tests ordered today include:  No orders of the defined types were placed in this encounter.   Disposition:   FU one year  Signed, Tonny Bollman, MD  10/25/2014 3:47 PM    [facility] [address] [place], [place], [place]  [address] Phone: [phone]; Fax: [phone]

## 2014-10-26 ENCOUNTER — Encounter: Payer: Self-pay | Admitting: Cardiovascular Disease

## 2015-01-04 ENCOUNTER — Other Ambulatory Visit: Payer: Self-pay | Admitting: Cardiovascular Disease

## 2015-01-22 ENCOUNTER — Telehealth: Payer: Self-pay | Admitting: Cardiovascular Disease

## 2015-01-22 NOTE — Telephone Encounter (Signed)
I spoke with the pt and made her aware of reason for repeat lab work.  Pt wanted to reschedule appointment to a later date due to her busy schedule.  Lab appointment scheduled on 03/22/15 per pt request.

## 2015-01-22 NOTE — Telephone Encounter (Signed)
New Message  Pt called. Has labs scheduled. Requests a call back to determine why they are needed if she doesn't have an appointment until 2017. Please call

## 2015-01-25 ENCOUNTER — Other Ambulatory Visit: Payer: Medicare HMO

## 2015-03-22 ENCOUNTER — Other Ambulatory Visit: Payer: Medicare HMO

## 2015-04-19 ENCOUNTER — Other Ambulatory Visit: Payer: Medicare HMO

## 2015-04-24 ENCOUNTER — Other Ambulatory Visit (INDEPENDENT_AMBULATORY_CARE_PROVIDER_SITE_OTHER): Payer: Medicare HMO

## 2015-04-24 DIAGNOSIS — I10 Essential (primary) hypertension: Secondary | ICD-10-CM

## 2015-05-01 ENCOUNTER — Other Ambulatory Visit (INDEPENDENT_AMBULATORY_CARE_PROVIDER_SITE_OTHER): Payer: Medicare HMO | Admitting: *Deleted

## 2015-05-01 DIAGNOSIS — E785 Hyperlipidemia, unspecified: Secondary | ICD-10-CM

## 2015-05-01 DIAGNOSIS — I10 Essential (primary) hypertension: Secondary | ICD-10-CM

## 2015-05-01 LAB — LIPID PANEL
CHOLESTEROL: 162 mg/dL (ref 125–200)
HDL: 54 mg/dL (ref >=46)
LDL Cholesterol: 97 mg/dL (ref <130)
Total CHOL/HDL Ratio: 3 Ratio (ref <=5.0)
Triglycerides: 53 mg/dL (ref <150)
VLDL: 11 mg/dL (ref <30)

## 2015-05-15 ENCOUNTER — Telehealth: Payer: Self-pay | Admitting: Cardiovascular Disease

## 2015-05-15 NOTE — Telephone Encounter (Signed)
New problem   Pt need to speak to your concerning the pains she is having in her neck. Please call pt.

## 2015-05-15 NOTE — Telephone Encounter (Signed)
I spoke with the pt and about 5-6 days ago she noticed a pain in her neck.  The pain is located in her neck at the base of her skull and radiates into the top of her head. Last night she placed heat on her neck and took Aleve and this helped her discomfort.  The pt also notices soreness in her neck when she presses in this area.  I made her aware that this sounds like muscular pain and she can contact her PCP if she continues to have issues. Pt agreed with plan.

## 2015-10-04 ENCOUNTER — Encounter: Payer: Self-pay | Admitting: Gastroenterology

## 2015-12-12 ENCOUNTER — Ambulatory Visit: Payer: Self-pay | Admitting: Cardiovascular Disease

## 2016-02-13 ENCOUNTER — Telehealth: Payer: Self-pay | Admitting: Cardiovascular Disease

## 2016-02-13 DIAGNOSIS — E785 Hyperlipidemia, unspecified: Secondary | ICD-10-CM

## 2016-02-13 NOTE — Telephone Encounter (Signed)
lmtcb

## 2016-02-13 NOTE — Telephone Encounter (Signed)
Calling stating that she would like to come in Fri 8/18 to have lab work.  She is scheduled to see Dr. Excell Seltzerooper on Mon 8/21.  Advised that all labs were normal at visit 10/25/14.  Will order LP and HFT.

## 2016-02-13 NOTE — Telephone Encounter (Signed)
New Message  Pt call requesting to speak with RN. Pt states she is to have lab work completed with appt on 8/21. No lab orders in the system. Pt wants to know if it possible to come in for labs on Friday 8/18. Please call back to discuss

## 2016-02-15 ENCOUNTER — Other Ambulatory Visit: Payer: Medicare Other | Admitting: *Deleted

## 2016-02-15 DIAGNOSIS — E785 Hyperlipidemia, unspecified: Secondary | ICD-10-CM

## 2016-02-15 LAB — HEPATIC FUNCTION PANEL
ALBUMIN: 3.7 g/dL (ref 3.6–5.1)
ALT: 14 U/L (ref 6–29)
AST: 19 U/L (ref 10–35)
Alkaline Phosphatase: 118 U/L (ref 33–130)
BILIRUBIN TOTAL: 1.3 mg/dL — AB (ref 0.2–1.2)
Bilirubin, Direct: 0.2 mg/dL (ref <=0.2)
Indirect Bilirubin: 1.1 mg/dL (ref 0.2–1.2)
Total Protein: 6.4 g/dL (ref 6.1–8.1)

## 2016-02-15 LAB — LIPID PANEL
Cholesterol: 264 mg/dL — ABNORMAL HIGH (ref 125–200)
HDL: 69 mg/dL (ref >=46)
LDL CALC: 180 mg/dL — AB (ref <130)
TRIGLYCERIDES: 77 mg/dL (ref <150)
Total CHOL/HDL Ratio: 3.8 Ratio (ref <=5.0)
VLDL: 15 mg/dL (ref <30)

## 2016-02-18 ENCOUNTER — Ambulatory Visit: Payer: Self-pay | Admitting: Cardiovascular Disease

## 2016-02-18 ENCOUNTER — Telehealth: Payer: Self-pay | Admitting: Cardiovascular Disease

## 2016-02-18 NOTE — Telephone Encounter (Signed)
I spoke with the pt and made her aware of lab results. The pt said she had a bad night and would like to reschedule her appointment with Dr Excell Seltzerooper.  I have rescheduled appointment to tomorrow.

## 2016-02-18 NOTE — Telephone Encounter (Signed)
New Message  Pt voiced she would like to speak with nurse about her blood work/cholesterol check in reference to her appt today.  Pt voiced she doesn't want to cancel her appt but speak to nurse to see what they can do or cannot do.  Pt voiced about getting a prescription so she wouldn't have to come in to her appt today.  Pt voiced no issues, no SOB or chest pain.

## 2016-02-19 ENCOUNTER — Ambulatory Visit (INDEPENDENT_AMBULATORY_CARE_PROVIDER_SITE_OTHER): Payer: Medicare Other | Admitting: Cardiovascular Disease

## 2016-02-19 VITALS — BP 144/86 | HR 86 | Ht 63.5 in | Wt 150.8 lb

## 2016-02-19 DIAGNOSIS — E785 Hyperlipidemia, unspecified: Secondary | ICD-10-CM | POA: Diagnosis not present

## 2016-02-19 DIAGNOSIS — I10 Essential (primary) hypertension: Secondary | ICD-10-CM

## 2016-02-19 NOTE — Patient Instructions (Addendum)
Medication Instructions:  Your physician recommends that you continue on your current medications as directed. Please refer to the Current Medication list given to you today.  Labwork: Your physician recommends that you return for a FASTING LIPID, BMP, CBC, TSH and Free T4--nothing to eat or drink after midnight, lab opens at 7:30 AM  Testing/Procedures: Your physician has requested that you have a carotid duplex (Vascuscreen $50). This test is an ultrasound of the carotid arteries in your neck. It looks at blood flow through these arteries that supply the brain with blood. Allow one hour for this exam. There are no restrictions or special instructions.  Follow-Up: Your physician wants you to follow-up in: 1 YEAR with Dr Excell Seltzerooper.  You will receive a reminder letter in the mail two months in advance. If you don't receive a letter, please call our office to schedule the follow-up appointment.   Any Other Special Instructions Will Be Listed Below (If Applicable).     If you need a refill on your cardiac medications before your next appointment, please call your pharmacy.

## 2016-02-19 NOTE — Progress Notes (Signed)
Cardiology Office Note Date:  02/20/2016   ID:  Janna ArchMildred Louise ZOXWRUEidkiff, DOB [birth year], MRN [id number]  PCP:  Lavell IslamLOWARD,DAVIS L, MD  Cardiologist:  Tonny Bollmanooper, Dantae Meunier, MD    Chief Complaint  Patient presents with  . Coronary Artery Disease   History of Present Illness: Waynetta PeanMildred Louise Levay is a 80 y.o. female who presents for follow-up of hypertension and hyperlipidemia. She has had problems tolerating statin drugs because of myalgias. Most recently she was on pravastatin but has discontinued this.   Doing well. Here alone today. Feels well and has no specific cardiac complaints. Today, she denies symptoms of palpitations, chest pain, shortness of breath, orthopnea, PND, lower extremity edema, dizziness, or syncope.  Past Medical History:  Diagnosis Date  . Allergic rhinitis   . Anxiety   . Depression    pt denies  . GERD (gastroesophageal reflux disease)   . History of renal stone   . Hyperlipidemia   . Hypertension   . Obesity   . Status post dilation of esophageal narrowing     Past Surgical History:  Procedure Laterality Date  . ABDOMINAL HYSTERECTOMY    . KIDNEY STONE SURGERY     1985    Current Outpatient Prescriptions  Medication Sig Dispense Refill  . amLODipine (NORVASC) 5 MG tablet Take 5 mg by mouth daily.    Marland Kitchen. aspirin 81 MG tablet Take 162 mg by mouth daily.     . Coenzyme Q-10 100 MG capsule Take 100 mg by mouth daily.    Marland Kitchen. lisinopril-hydrochlorothiazide (PRINZIDE,ZESTORETIC) 20-25 MG per tablet Take 1 tablet by mouth daily. 90 tablet 3  . VITAMIN D, CHOLECALCIFEROL, PO Take 5,000 Units by mouth daily.     No current facility-administered medications for this visit.     Allergies:   Prednisone; Penicillins; Sulfa antibiotics; Sulfonamide derivatives; and Sulfamethoxazole   Social History:  The patient  reports that she has never smoked. She has never used smokeless tobacco. She reports that she does not drink alcohol or use drugs.   Family History:   The patient's family history includes Heart disease in her father; Heart disease (age of onset: 2857) in her mother; Hypertension in her mother; Kidney disease in her father; Prostate cancer in her father.    ROS:  Please see the history of present illness.  All other systems are reviewed and negative.    PHYSICAL EXAM: VS:  BP (!) 144/86   Pulse 86   Ht 5' 3.5" (1.613 m)   Wt 68.4 kg (150 lb 12.8 oz)   SpO2 97%   BMI 26.29 kg/m  , BMI Body mass index is 26.29 kg/m. GEN: Well nourished, well developed, in no acute distress  HEENT: normal  Neck: no JVD, no masses. No carotid bruits Cardiac: RRR without murmur or gallop                Respiratory:  clear to auscultation bilaterally, normal work of breathing GI: soft, nontender, nondistended, + BS MS: no deformity or atrophy  Ext: no pretibial edema, pedal pulses 2+= bilaterally Skin: warm and dry, no rash Neuro:  Strength and sensation are intact Psych: euthymic mood, full affect  EKG:  EKG is ordered today. The ekg ordered today shows NSR 86 bpm, within normal limits  Recent Labs: 02/15/2016: ALT 14   Lipid Panel     Component Value Date/Time   CHOL 264 (H) 02/15/2016 1510   TRIG 77 02/15/2016 1510   HDL 69 02/15/2016 1510  CHOLHDL 3.8 02/15/2016 1510   VLDL 15 02/15/2016 1510   LDLCALC 180 (H) 02/15/2016 1510   LDLDIRECT 112.1 07/03/2011 1046      Wt Readings from Last 3 Encounters:  02/19/16 68.4 kg (150 lb 12.8 oz)  10/25/14 72.5 kg (159 lb 12.8 oz)  07/20/13 72.3 kg (159 lb 6.4 oz)     ASSESSMENT AND PLAN: 1.  Hyperlipidemia: recent lipids reviewed and are much higher than in the past, even at times when she has been off of a statin drug. Will repeat labs to make sure they are accurate. Will add thyroid function studies to evaluate for other causes of worsening hyperlipidemia. She has lost 10# since last year's visit so diet/lifestyle do not explain worsening labs. Consider restarting pravastatin at low dose if  lipids remain significantly elevated.  2. HTN: BP controlled on current Rx.   Current medicines are reviewed with the patient today.  The patient does not have concerns regarding medicines.  Labs/ tests ordered today include:   Orders Placed This Encounter  Procedures  . TSH  . Basic metabolic panel  . T4, free  . Lipid panel  . CBC  . EKG 12-Lead    Disposition:   FU one year  Signed, Tonny Bollmanooper, Gearlene Godsil, MD  02/20/2016 9:26 PM    [facility] [address], [place], [place]  [address] Phone: [phone]; Fax: [phone]

## 2016-02-26 ENCOUNTER — Other Ambulatory Visit: Payer: Medicare Other | Admitting: *Deleted

## 2016-02-26 DIAGNOSIS — E785 Hyperlipidemia, unspecified: Secondary | ICD-10-CM

## 2016-02-26 DIAGNOSIS — I10 Essential (primary) hypertension: Secondary | ICD-10-CM

## 2016-02-26 LAB — T4, FREE: FREE T4: 1.2 ng/dL (ref 0.8–1.8)

## 2016-02-26 LAB — LIPID PANEL
CHOL/HDL RATIO: 3.8 ratio (ref <=5.0)
Cholesterol: 245 mg/dL — ABNORMAL HIGH (ref 125–200)
HDL: 64 mg/dL (ref >=46)
LDL CALC: 162 mg/dL — AB (ref <130)
Triglycerides: 93 mg/dL (ref <150)
VLDL: 19 mg/dL (ref <30)

## 2016-02-26 LAB — CBC
HCT: 40.6 % (ref 35.0–45.0)
Hemoglobin: 13.8 g/dL (ref 11.7–15.5)
MCH: 31.9 pg (ref 27.0–33.0)
MCHC: 34 g/dL (ref 32.0–36.0)
MCV: 94 fL (ref 80.0–100.0)
MPV: 11.1 fL (ref 7.5–12.5)
PLATELETS: 249 10*3/uL (ref 140–400)
RBC: 4.32 MIL/uL (ref 3.80–5.10)
RDW: 13.4 % (ref 11.0–15.0)
WBC: 4.8 10*3/uL (ref 3.8–10.8)

## 2016-02-26 LAB — BASIC METABOLIC PANEL
BUN: 15 mg/dL (ref 7–25)
CO2: 30 mmol/L (ref 20–31)
Calcium: 9.6 mg/dL (ref 8.6–10.4)
Chloride: 102 mmol/L (ref 98–110)
Creat: 0.82 mg/dL (ref 0.60–0.93)
GLUCOSE: 87 mg/dL (ref 65–99)
POTASSIUM: 3.8 mmol/L (ref 3.5–5.3)
Sodium: 141 mmol/L (ref 135–146)

## 2016-02-26 LAB — TSH: TSH: 1.82 mIU/L

## 2016-03-05 ENCOUNTER — Telehealth: Payer: Self-pay | Admitting: Cardiovascular Disease

## 2016-03-05 DIAGNOSIS — E785 Hyperlipidemia, unspecified: Secondary | ICD-10-CM

## 2016-03-05 NOTE — Telephone Encounter (Signed)
Follow Up:    Pt would like lab results from last week please.

## 2016-03-05 NOTE — Telephone Encounter (Signed)
Spoke with pt and informed her that we were waiting on Dr. Excell Seltzerooper to do an official review of labs.  Did provide lipid information as pt inquired about those.  Pt wanted me to let Dr. Excell Seltzerooper know that she had problems with statins in the past (caused muscle aches).  She said she is willing to try whatever Dr. Excell Seltzerooper recommends but she doesn't think she will do well with oral statins.  Will forward to Dr. Excell Seltzerooper for review.

## 2016-03-06 MED ORDER — PRAVASTATIN SODIUM 20 MG PO TABS
20.0000 mg | ORAL_TABLET | Freq: Every evening | ORAL | 3 refills | Status: DC
Start: 2016-03-06 — End: 2017-06-12

## 2016-03-06 NOTE — Telephone Encounter (Signed)
I think best to go on pravastatin 20 mg daily, repeat labs 3 months, if she is able to tolerate the low dose of this 'gentle' statin drug.

## 2016-03-06 NOTE — Telephone Encounter (Signed)
Pt in agreement to try Pravastatin.  Scheduled labs for 12/7.  Pt aware to call if she has any problems with the medication.  Pt verbalized understanding and was appreciative for assistance.

## 2016-03-12 ENCOUNTER — Other Ambulatory Visit: Payer: Self-pay | Admitting: Cardiovascular Disease

## 2016-03-13 ENCOUNTER — Inpatient Hospital Stay (HOSPITAL_COMMUNITY): Admission: RE | Admit: 2016-03-13 | Payer: Medicare Other | Source: Ambulatory Visit

## 2016-06-05 ENCOUNTER — Other Ambulatory Visit: Payer: Medicare Other

## 2016-06-11 ENCOUNTER — Encounter (HOSPITAL_COMMUNITY): Payer: Self-pay | Admitting: Cardiovascular Disease

## 2016-06-12 ENCOUNTER — Other Ambulatory Visit: Payer: Medicare Other

## 2016-07-17 ENCOUNTER — Other Ambulatory Visit: Payer: Medicare Other

## 2016-08-07 ENCOUNTER — Other Ambulatory Visit: Payer: Medicare Other

## 2016-08-21 ENCOUNTER — Inpatient Hospital Stay (HOSPITAL_COMMUNITY): Admission: RE | Admit: 2016-08-21 | Payer: Medicare Other | Source: Ambulatory Visit

## 2016-08-21 ENCOUNTER — Other Ambulatory Visit: Payer: Medicare Other

## 2016-09-04 ENCOUNTER — Other Ambulatory Visit: Payer: Medicare Other

## 2016-09-09 ENCOUNTER — Ambulatory Visit (HOSPITAL_COMMUNITY): Payer: Medicare Other

## 2016-09-11 ENCOUNTER — Other Ambulatory Visit: Payer: Medicare Other

## 2016-09-18 ENCOUNTER — Ambulatory Visit (HOSPITAL_COMMUNITY): Payer: Medicare Other

## 2016-09-25 ENCOUNTER — Other Ambulatory Visit: Payer: Medicare Other

## 2016-10-09 ENCOUNTER — Inpatient Hospital Stay (HOSPITAL_COMMUNITY): Admission: RE | Admit: 2016-10-09 | Payer: Medicare Other | Source: Ambulatory Visit

## 2016-10-16 ENCOUNTER — Other Ambulatory Visit: Payer: Medicare Other

## 2016-10-17 ENCOUNTER — Other Ambulatory Visit: Payer: Medicare Other

## 2016-11-06 ENCOUNTER — Ambulatory Visit (HOSPITAL_COMMUNITY): Payer: Medicare Other

## 2016-11-21 ENCOUNTER — Inpatient Hospital Stay (HOSPITAL_COMMUNITY): Admission: RE | Admit: 2016-11-21 | Payer: Medicare Other | Source: Ambulatory Visit

## 2017-01-21 ENCOUNTER — Other Ambulatory Visit: Payer: Self-pay | Admitting: Cardiovascular Disease

## 2017-04-24 ENCOUNTER — Ambulatory Visit: Payer: Medicare Other | Admitting: Cardiovascular Disease

## 2017-04-24 NOTE — Progress Notes (Deleted)
Cardiology Office Note Date:  04/24/2017   ID:  Jessica Ware, DOB 01-02-1936, MRN [id number]  PCP:  Lavell Islam, MD  Cardiologist:  Tonny Bollman, MD    No chief complaint on file.    History of Present Illness: Jessica Ware is a 81 y.o. female who presents for follow-up of hypertension and hyperlipidemia.  Patient has a long history of statin intolerance.  She has been tried on multiple agents.   Past Medical History:  Diagnosis Date  . Allergic rhinitis   . Anxiety   . Depression    pt denies  . GERD (gastroesophageal reflux disease)   . History of renal stone   . Hyperlipidemia   . Hypertension   . Obesity   . Status post dilation of esophageal narrowing     Past Surgical History:  Procedure Laterality Date  . ABDOMINAL HYSTERECTOMY    . KIDNEY STONE SURGERY     1985    Current Outpatient Prescriptions  Medication Sig Dispense Refill  . amLODipine (NORVASC) 5 MG tablet Take 5 mg by mouth daily.    Marland Kitchen amLODipine (NORVASC) 5 MG tablet TAKE 1 TABLET BY MOUTH EVERY DAY 90 tablet 3  . aspirin 81 MG tablet Take 162 mg by mouth daily.     . Coenzyme Q-10 100 MG capsule Take 100 mg by mouth daily.    Marland Kitchen lisinopril-hydrochlorothiazide (PRINZIDE,ZESTORETIC) 20-25 MG tablet TAKE 1 TABLET BY MOUTH EVERY DAY 90 tablet 0  . pravastatin (PRAVACHOL) 20 MG tablet Take 1 tablet (20 mg total) by mouth every evening. 90 tablet 3  . VITAMIN D, CHOLECALCIFEROL, PO Take 5,000 Units by mouth daily.     No current facility-administered medications for this visit.     Allergies:   Prednisone; Penicillins; Sulfa antibiotics; Sulfonamide derivatives; and Sulfamethoxazole   Social History:  The patient  reports that she has never smoked. She has never used smokeless tobacco. She reports that she does not drink alcohol or use drugs.   Family History:  The patient's *** family history includes Heart disease in her father; Heart disease (age of onset: 34) in her  mother; Hypertension in her mother; Kidney disease in her father; Prostate cancer in her father.    ROS:  Please see the history of present illness.  Otherwise, review of systems is positive for ***.  All other systems are reviewed and negative.    PHYSICAL EXAM: VS:  There were no vitals taken for this visit. , BMI There is no height or weight on file to calculate BMI. GEN: Well nourished, well developed, in no acute distress  HEENT: normal  Neck: no JVD, no masses. No carotid bruits*** Cardiac: ***RRR without murmur or gallop                Respiratory:  clear to auscultation bilaterally, normal work of breathing GI: soft, nontender, nondistended, + BS MS: no deformity or atrophy  Ext: no pretibial edema, ***pedal pulses 2+= bilaterally Skin: warm and dry, no rash Neuro:  Strength and sensation are intact Psych: euthymic mood, full affect  EKG:  EKG {ACTION; IS/IS [id number]} ordered today. The ekg ordered today shows ***  Recent Labs: No results found for requested labs within last 8760 hours.   Lipid Panel     Component Value Date/Time   CHOL 245 (H) 02/26/2016 1332   TRIG 93 02/26/2016 1332   HDL 64 02/26/2016 1332   CHOLHDL 3.8 02/26/2016 1332   VLDL 19 02/26/2016  1332   LDLCALC 162 (H) 02/26/2016 1332   LDLDIRECT 112.1 07/03/2011 1046      Wt Readings from Last 3 Encounters:  02/19/16 150 lb 12.8 oz (68.4 kg)  10/25/14 159 lb 12.8 oz (72.5 kg)  07/20/13 159 lb 6.4 oz (72.3 kg)     Cardiac Studies Reviewed: ***  ASSESSMENT AND PLAN: 1.  Hyperlipidemia: Lipids 1 year ago showed a cholesterol of 245, HDL 64, and LDL 162.  2.  Hypertension:  Current medicines are reviewed with the patient today.  The patient does not have concerns regarding medicines.  Labs/ tests ordered today include:  No orders of the defined types were placed in this encounter.   Disposition:   FU one year  Signed, Tonny Bollmanooper, Amon Costilla, MD  04/24/2017 2:11 PM    [facility] [address], [place], [place]  [address] Phone: [phone]) [phone]; Fax: [phone]) [phone]

## 2017-05-06 ENCOUNTER — Other Ambulatory Visit: Payer: Self-pay | Admitting: Cardiovascular Disease

## 2017-05-06 ENCOUNTER — Telehealth: Payer: Self-pay

## 2017-05-06 DIAGNOSIS — E785 Hyperlipidemia, unspecified: Secondary | ICD-10-CM

## 2017-05-06 DIAGNOSIS — I10 Essential (primary) hypertension: Secondary | ICD-10-CM

## 2017-05-06 MED ORDER — AMLODIPINE BESYLATE 5 MG PO TABS: 5.0000 mg | ORAL_TABLET | Freq: Every day | ORAL | 3 refills | Status: DC

## 2017-05-06 NOTE — Telephone Encounter (Signed)
-----   Message from Sigurd SosKedra K Sumner sent at 05/06/2017  1:36 PM EST ----- 05/06/2017 Pt husband verbalized that pt needs her medication refilled and the pharmacy declined.  Informed pt husband that an appt maybe needed for refills. He agreed to 05/08/2017 with Lorin PicketScott weaver at 10:45  He asked wife and she said no, she is going to get her hair done and he told me to cancel with scott.  Pt husband want to speak to rn to have medication refilled without seeing Dr.Cooper first   Please call, thank you.

## 2017-05-06 NOTE — Telephone Encounter (Signed)
°*  STAT* If patient is at the pharmacy, call can be transferred to refill team.   1. Which medications need to be refilled? (please list name of each medication and dose if known) adalidapine 2.5mg  2. Which pharmacy/location (including street and city if local pharmacy) is medication to be sent to? [facility]   3. Do they need a 30 day or 90 day supply? 90

## 2017-05-06 NOTE — Telephone Encounter (Signed)
Patient needs refills for amlodipine. Scheduled patient for overdue follow-up with Dr. Excell Seltzerooper in January with fasting lab work scheduled before.  Refills called in to appointment. Patient was grateful for call.

## 2017-05-07 MED ORDER — AMLODIPINE BESYLATE 5 MG PO TABS: 5.0000 mg | ORAL_TABLET | Freq: Every day | ORAL | 2 refills | Status: DC

## 2017-05-07 NOTE — Addendum Note (Signed)
Addended by: Demetrios LollBARNARD, CATHY C on: 05/07/2017 10:03 AM   Modules accepted: Orders

## 2017-05-08 ENCOUNTER — Ambulatory Visit: Payer: Medicare Other | Admitting: Physician Assistant

## 2017-05-12 ENCOUNTER — Ambulatory Visit: Payer: Medicare Other | Admitting: Physician Assistant

## 2017-06-12 ENCOUNTER — Other Ambulatory Visit: Payer: Self-pay | Admitting: Cardiovascular Disease

## 2017-07-23 ENCOUNTER — Other Ambulatory Visit: Payer: Medicare Other

## 2017-07-24 ENCOUNTER — Other Ambulatory Visit: Payer: Medicare Other | Admitting: *Deleted

## 2017-07-25 LAB — COMPREHENSIVE METABOLIC PANEL
ALT: 13 IU/L (ref 0–32)
AST: 19 IU/L (ref 0–40)
Albumin/Globulin Ratio: 1.3 (ref 1.2–2.2)
Albumin: 3.7 g/dL (ref 3.5–4.8)
Alkaline Phosphatase: 128 IU/L — ABNORMAL HIGH (ref 39–117)
BILIRUBIN TOTAL: 1.1 mg/dL (ref 0.0–1.2)
BUN/Creatinine Ratio: 21 (ref 12–28)
BUN: 15 mg/dL (ref 8–27)
CALCIUM: 9.8 mg/dL (ref 8.7–10.3)
CHLORIDE: 99 mmol/L (ref 96–106)
CO2: 28 mmol/L (ref 20–29)
Creatinine, Ser: 0.71 mg/dL (ref 0.57–1.00)
GFR, EST AFRICAN AMERICAN: 94 mL/min/{1.73_m2} (ref >59)
GFR, EST NON AFRICAN AMERICAN: 81 mL/min/{1.73_m2} (ref >59)
GLUCOSE: 93 mg/dL (ref 65–99)
Globulin, Total: 2.9 g/dL (ref 1.5–4.5)
Potassium: 4.5 mmol/L (ref 3.5–5.2)
Sodium: 140 mmol/L (ref 134–144)
TOTAL PROTEIN: 6.6 g/dL (ref 6.0–8.5)

## 2017-07-25 LAB — LIPID PANEL
Chol/HDL Ratio: 3.8 ratio (ref 0.0–4.4)
Cholesterol, Total: 236 mg/dL — ABNORMAL HIGH (ref 100–199)
HDL: 62 mg/dL (ref >39)
LDL Calculated: 158 mg/dL — ABNORMAL HIGH (ref 0–99)
TRIGLYCERIDES: 81 mg/dL (ref 0–149)
VLDL CHOLESTEROL CAL: 16 mg/dL (ref 5–40)

## 2017-07-28 ENCOUNTER — Telehealth: Payer: Self-pay | Admitting: Cardiovascular Disease

## 2017-07-28 NOTE — Telephone Encounter (Signed)
Patient rescheduled appointment from tomorrow to March. Per DPR form, left detailed message with results. Instructed her to call with questions or concerns.

## 2017-07-28 NOTE — Telephone Encounter (Signed)
New message ° ° ° °Patient calling for lab results °

## 2017-07-28 NOTE — Telephone Encounter (Signed)
-----   Message from Tonny BollmanMichael Cooper, MD sent at 07/26/2017  9:35 PM EST ----- Lipids elevated, consistent with past readings. Other labs within range and stable. Will review treatment options at upcoming office visit.

## 2017-07-29 ENCOUNTER — Ambulatory Visit: Payer: Medicare Other | Admitting: Cardiovascular Disease

## 2017-08-15 ENCOUNTER — Other Ambulatory Visit: Payer: Self-pay | Admitting: Cardiovascular Disease

## 2017-09-02 ENCOUNTER — Ambulatory Visit: Payer: Medicare Other | Admitting: Cardiovascular Disease

## 2017-09-10 ENCOUNTER — Other Ambulatory Visit: Payer: Self-pay | Admitting: Cardiovascular Disease

## 2017-10-12 ENCOUNTER — Ambulatory Visit (INDEPENDENT_AMBULATORY_CARE_PROVIDER_SITE_OTHER): Payer: Medicare Other | Admitting: Cardiovascular Disease

## 2017-10-12 ENCOUNTER — Encounter: Payer: Self-pay | Admitting: Cardiovascular Disease

## 2017-10-12 VITALS — BP 154/78 | HR 72 | Ht 63.0 in | Wt 153.0 lb

## 2017-10-12 DIAGNOSIS — I10 Essential (primary) hypertension: Secondary | ICD-10-CM | POA: Diagnosis not present

## 2017-10-12 DIAGNOSIS — E785 Hyperlipidemia, unspecified: Secondary | ICD-10-CM | POA: Diagnosis not present

## 2017-10-12 NOTE — Progress Notes (Signed)
Cardiology Office Note Date:  10/12/2017   ID:  Julaine Zimny, DOB [birth year], MRN [id number]  PCP:  Lavell Islam, MD  Cardiologist:  Tonny Bollman, MD    Chief Complaint  Patient presents with  . Follow-up    hyperlipidemia/HTN     History of Present Illness: Jessica Ware is a 82 y.o. female who presents for follow-up of hypertension and hyperlipidemia.  She is been statin intolerant.  The patient is here alone today.she complains of left arm pain over the past few weeks. The pain starts in the left shoulder and extends down the arm. She also has mild pain in the right shoulder. Her pain is exacerbated by raising her left arm or other specific arm movements.  She otherwise feels well and denies chest pain, chest pressure, or shortness of breath.  Her shoulder and arm pain or not exacerbated by walking or physical activity and less she is actually using that arm.  There is no lightheadedness, heart palpitations, or syncope.   Past Medical History:  Diagnosis Date  . Allergic rhinitis   . Anxiety   . Depression    pt denies  . GERD (gastroesophageal reflux disease)   . History of renal stone   . Hyperlipidemia   . Hypertension   . Obesity   . Status post dilation of esophageal narrowing     Past Surgical History:  Procedure Laterality Date  . ABDOMINAL HYSTERECTOMY    . KIDNEY STONE SURGERY     1985    Current Outpatient Medications  Medication Sig Dispense Refill  . acetaminophen (TYLENOL) 500 MG tablet Take 500 mg by mouth as needed for pain.    Marland Kitchen amLODipine (NORVASC) 2.5 MG tablet TAKE 2 TABLETS DAILY 60 tablet 0  . aspirin 81 MG tablet Take 81 mg by mouth daily.     . Coenzyme Q-10 100 MG capsule Take 100 mg by mouth daily.    Marland Kitchen ibuprofen (ADVIL,MOTRIN) 200 MG tablet Take 200 mg by mouth as needed for pain.    Marland Kitchen lisinopril-hydrochlorothiazide (PRINZIDE,ZESTORETIC) 20-25 MG tablet Take 1 tablet by mouth daily. Patient must keep 09/02/17  appointment for further refills to be granted or obtain from pcp. 30 tablet 0  . VITAMIN D, CHOLECALCIFEROL, PO Take 5,000 Units by mouth daily.    . pravastatin (PRAVACHOL) 20 MG tablet Take 1 tablet (20 mg total) by mouth every evening. 90 tablet 0   No current facility-administered medications for this visit.     Allergies:   Prednisone; Penicillins; Sulfa antibiotics; Sulfonamide derivatives; and Sulfamethoxazole   Social History:  The patient  reports that she has never smoked. She has never used smokeless tobacco. She reports that she does not drink alcohol or use drugs.   Family History:  The patient's family history includes Heart disease in her father; Heart disease (age of onset: 28) in her mother; Hypertension in her mother; Kidney disease in her father; Prostate cancer in her father.   ROS:  Please see the history of present illness.  Otherwise, review of systems is positive for cough, muscle pain, constipation. All other systems are reviewed and negative.   PHYSICAL EXAM: VS:  BP (!) 154/78 (BP Location: Left Arm, Patient Position: Sitting, Cuff Size: Normal)   Pulse 72   Ht 5\' 3"  (1.6 m)   Wt 153 lb (69.4 kg)   SpO2 94%   BMI 27.10 kg/m  , BMI Body mass index is 27.1 kg/m. GEN: Well nourished, well  developed, in no acute distress  HEENT: normal  Neck: no JVD, no masses. No carotid bruits Cardiac: RRR without murmur or gallop     Respiratory:  clear to auscultation bilaterally, normal work of breathing The left shoulder is tender to palpation over the anterior aspect. GI: soft, nontender, nondistended, + BS MS: no deformity or atrophy  Ext: no pretibial edema, pedal pulses 2+= bilaterally Skin: warm and dry, no rash Neuro:  Strength and sensation are intact Psych: euthymic mood, full affect  EKG:  EKG is ordered today. The ekg ordered today shows normal sinus rhythm 72 bpm, within normal limits.  Recent Labs: 07/24/2017: ALT 13; BUN 15; Creatinine, Ser 0.71;  Potassium 4.5; Sodium 140   Lipid Panel     Component Value Date/Time   CHOL 236 (H) 07/24/2017 1330   TRIG 81 07/24/2017 1330   HDL 62 07/24/2017 1330   CHOLHDL 3.8 07/24/2017 1330   CHOLHDL 3.8 02/26/2016 1332   VLDL 19 02/26/2016 1332   LDLCALC 158 (H) 07/24/2017 1330   LDLDIRECT 112.1 07/03/2011 1046      Wt Readings from Last 3 Encounters:  10/12/17 153 lb (69.4 kg)  02/19/16 150 lb 12.8 oz (68.4 kg)  10/25/14 159 lb 12.8 oz (72.5 kg)    ASSESSMENT AND PLAN: 1.  Mixed hyperlipidemia: Lipids remain elevated off of all statin drugs.  I reviewed her labs from January demonstrating a cholesterol of 236, LDL 158, HDL 62.  She started back on pravastatin 20 mg since the time of her blood work.  Therefore, we will add labs in about 1 month to reassess lipids and LFTs.  2.  Essential hypertension: BP elevated today but having significant pain related to her arm/shoulder. Also feeling anxious. Continue same Rx.   3.  Left shoulder and arm pain: Clearly sounds musculoskeletal probably related to left shoulder pathology.  She is going to follow-up with her orthopedist.  Current medicines are reviewed with the patient today.  The patient does not have concerns regarding medicines.  Labs/ tests ordered today include:   Orders Placed This Encounter  Procedures  . Hepatic function panel  . Lipid panel  . EKG 12-Lead   Disposition:   FU one year  Signed, Tonny BollmanMichael Yanina Knupp, MD  10/12/2017 5:40 PM    [facility] [address], [place], [place]  [address] Phone: [phone]; Fax: [phone]

## 2017-10-12 NOTE — Patient Instructions (Signed)
Medication Instructions:  Your provider recommends that you continue on your current medications as directed. Please refer to the Current Medication list given to you today.    Labwork: Your provider recommends that you return for FASTING lab work in 1 MONTH. You may come any time between 7:45AM and 4:45PM.  Testing/Procedures: None  Follow-Up: Your provider wants you to follow-up in: 1 year with Dr. Excell Seltzerooper. You will receive a reminder letter in the mail two months in advance. If you don't receive a letter, please call our office to schedule the follow-up appointment.    Any Other Special Instructions Will Be Listed Below (If Applicable).     If you need a refill on your cardiac medications before your next appointment, please call your pharmacy.

## 2017-11-19 ENCOUNTER — Other Ambulatory Visit: Payer: Medicare Other | Admitting: *Deleted

## 2017-11-19 DIAGNOSIS — E785 Hyperlipidemia, unspecified: Secondary | ICD-10-CM

## 2017-11-20 LAB — HEPATIC FUNCTION PANEL
ALT: 13 IU/L (ref 0–32)
AST: 19 IU/L (ref 0–40)
Albumin: 4 g/dL (ref 3.5–4.8)
Alkaline Phosphatase: 116 IU/L (ref 39–117)
BILIRUBIN TOTAL: 0.8 mg/dL (ref 0.0–1.2)
BILIRUBIN, DIRECT: 0.19 mg/dL (ref 0.00–0.40)
TOTAL PROTEIN: 6.5 g/dL (ref 6.0–8.5)

## 2017-11-20 LAB — LIPID PANEL
CHOL/HDL RATIO: 2.5 ratio (ref 0.0–4.4)
Cholesterol, Total: 180 mg/dL (ref 100–199)
HDL: 73 mg/dL (ref >39)
LDL CALC: 97 mg/dL (ref 0–99)
Triglycerides: 51 mg/dL (ref 0–149)
VLDL CHOLESTEROL CAL: 10 mg/dL (ref 5–40)

## 2017-12-28 ENCOUNTER — Other Ambulatory Visit: Payer: Self-pay | Admitting: Cardiovascular Disease

## 2018-01-22 ENCOUNTER — Other Ambulatory Visit: Payer: Self-pay | Admitting: Cardiovascular Disease

## 2018-01-22 DIAGNOSIS — I10 Essential (primary) hypertension: Secondary | ICD-10-CM

## 2018-01-22 MED ORDER — AMLODIPINE BESYLATE 2.5 MG PO TABS: 5.0000 mg | ORAL_TABLET | Freq: Every day | ORAL | 3 refills | Status: DC

## 2018-01-22 NOTE — Telephone Encounter (Signed)
Pt's pharmacy calling about pt's amlodipine. Pt has been taking amlodipine 5 mg tablet daily. Pt now has amlodipine 2.5 mg tablets 2 tablets daily. Pharmacist stated that pt's husband wanted pt to get amlodipine 2.5 mg tablet BID. Please advise which medication pt is supposed to be taking. Please address

## 2018-01-22 NOTE — Telephone Encounter (Signed)
Left message to call back  

## 2018-01-22 NOTE — Telephone Encounter (Signed)
Spoke with the pt and new Rx [facility] for her Amlodipine 2.5mg  po bid... Their previous pharmacy [facility] had closed down.

## 2018-01-29 ENCOUNTER — Telehealth: Payer: Self-pay | Admitting: Cardiovascular Disease

## 2018-01-29 NOTE — Telephone Encounter (Signed)
Order Providers   Prescribing Provider Encounter Provider  Tonny Bollmanooper, Michael, MD Tonny Bollmanooper, Michael, MD  Outpatient Medication Detail    Disp Refills Start End   amLODipine (NORVASC) 2.5 MG tablet 180 tablet 3 01/22/2018 01/22/2019   Sig - Route: Take 2 tablets (5 mg total) by mouth daily. - Oral   Class: Print   Notes to Pharmacy: This prescription was filled on 09/10/2017. Any refills authorized will be placed on file.   Associated Diagnoses   Essential hypertension - Primary     Pharmacy   [facility]- [place] - [place], [place] - [address]

## 2018-01-29 NOTE — Telephone Encounter (Addendum)
New message    Patient waiting at pharmacy now      01/22/18 5:04 PM  Note    Spoke with the pt and [facility] for her Amlodipine 2.5mg  po bid... Their previous pharmacy [facility] had closed down.                1. Which medications need to be refilled? (please list name of each medication and dose if known) amLODipine (NORVASC) 2.5 MG tablet  2. Which pharmacy/location (including street and city if local pharmacy) is medication to be sent to? [facility] [place] [address] 3. Do they need a 30 day or 90 day supply? 30

## 2018-01-30 ENCOUNTER — Other Ambulatory Visit: Payer: Self-pay | Admitting: Cardiovascular Disease

## 2018-01-30 DIAGNOSIS — I10 Essential (primary) hypertension: Secondary | ICD-10-CM

## 2018-02-10 ENCOUNTER — Telehealth: Payer: Self-pay | Admitting: Cardiovascular Disease

## 2018-02-10 DIAGNOSIS — I10 Essential (primary) hypertension: Secondary | ICD-10-CM

## 2018-02-10 NOTE — Telephone Encounter (Signed)
New Message:  Patient would like a call back concerning her BP. Patient feel BP is too high  BP: 178/87 (Today)  180/97(Last Night) NO Other Symptoms  Dot Phrases not working.

## 2018-02-10 NOTE — Telephone Encounter (Signed)
Agree - would stay on amlodipine 2.5 mg at the higher dosing schedule of BID dosing and follow BP. thx

## 2018-02-10 NOTE — Telephone Encounter (Signed)
Spoke with patient who wanted to inform me that her BP at 4pm was 147/62.  She will continue to take her medication as prescribed and notify us of her BP.

## 2018-02-10 NOTE — Telephone Encounter (Signed)
Spoke to patient who is concerned about her BP.  She said that her BP was doing so well that she reduced her Amlodipine to 2.5 mg daily instead of prescribed bid.    She checked her BP on 8/13 evening, it was 173/90 so she took another 2.5 mg.  This morning she checked it at 8 am 157/83 and 10 am 170/85.  I told her to take the second 2.5 mg at 2 pm and call us at 4 pm with a BP.  She is asymptomatic.  I recommended that she continues her Amlodipine 2.5 mg bid, but that I would get your recommendation.  Please advise, thank you

## 2018-02-11 MED ORDER — AMLODIPINE BESYLATE 2.5 MG PO TABS: 2.5000 mg | ORAL_TABLET | Freq: Two times a day (BID) | ORAL | 3 refills | Status: DC

## 2018-02-11 NOTE — Telephone Encounter (Signed)
Confirmed with patient she is to take amlodipine 2.5 mg BID. She states her BP was elevated this morning, but she has been checking her BP prior to taking medications. Instructed her to take BP 1-2 hours after taking medications and to call if BP is elevated. She was grateful for call and agrees with treatment plan.

## 2018-09-15 ENCOUNTER — Other Ambulatory Visit: Payer: Self-pay | Admitting: Cardiovascular Disease

## 2018-10-01 ENCOUNTER — Telehealth: Payer: Self-pay | Admitting: Physician Assistant

## 2018-10-01 NOTE — Telephone Encounter (Signed)
   Primary Cardiologist:  Tonny Bollman, MD   Patient contacted.  History reviewed.  No symptoms to suggest any unstable cardiac conditions.  Based on discussion, with current pandemic situation, we will be postponing this appointment for [facility] with a plan for f/u in 15 wks or sooner if feasible/necessary.  If symptoms change, she has been instructed to contact our office. She is not interested in virtual visit.   Routing to C19 CANCEL pool for tracking (P CV DIV CV19 CANCEL - reason for visit "other.") and assigning priority (1 = 4-6 wks, 2 = 6-12 wks, 3 = >12 wks).   [place], [place]  10/01/2018 1:13 PM         .

## 2018-10-05 ENCOUNTER — Other Ambulatory Visit: Payer: Medicare Other

## 2018-10-12 ENCOUNTER — Ambulatory Visit: Payer: Medicare Other | Admitting: Physician Assistant

## 2018-10-18 NOTE — Telephone Encounter (Signed)
Please call patient and schedule virtual visit with Bhagat if agreeable or put in recall for Augus

## 2018-10-18 NOTE — Telephone Encounter (Signed)
Call placed to pt to reschedule her 10/12/2018 appt with Vin Bhagat, PA-C. Left a message for pt to call back.

## 2018-10-21 NOTE — Telephone Encounter (Signed)
Called pt to reschedule appt that was cancelled in March due to covid 19. Pt declined mychart but has given verbal consent for virtual phone visit.  She has been scheduled for 10/26/2018 with [name], PA-C Will call her home phone on file     Virtual Visit Pre-Appointment Phone Call  "(Name), I am calling you today to discuss your upcoming appointment. We are currently trying to limit exposure to the virus that causes COVID-19 by seeing patients at home rather than in the office."  1. "What is the BEST phone number to call the day of the visit?" - include this in appointment notes  2. "Do you have or have access to (through a family member/friend) a smartphone with video capability that we can use for your visit?" a. If yes - list this number in appt notes as "cell" (if different from BEST phone #) and list the appointment type as a VIDEO visit in appointment notes b. If no - list the appointment type as a PHONE visit in appointment notes  3. Confirm consent - "In the setting of the current Covid19 crisis, you are scheduled for a (phone or video) visit with your provider on (date) at (time).  Just as we do with many in-office visits, in order for you to participate in this visit, we must obtain consent.  If you'd like, I can send this to your mychart (if signed up) or email for you to review.  Otherwise, I can obtain your verbal consent now.  All virtual visits are billed to your insurance company just like a normal visit would be.  By agreeing to a virtual visit, we'd like you to understand that the technology does not allow for your provider to perform an examination, and thus may limit your provider's ability to fully assess your condition. If your provider identifies any concerns that need to be evaluated in person, we will make arrangements to do so.  Finally, though the technology is pretty good, we cannot assure that it will always work on either your or our end, and in the setting of a  video visit, we may have to convert it to a phone-only visit.  In either situation, we cannot ensure that we have a secure connection.  Are you willing to proceed?" STAFF: Did the patient verbally acknowledge consent to telehealth visit? Document YES/NO here: YES  4. Advise patient to be prepared - "Two hours prior to your appointment, go ahead and check your blood pressure, pulse, oxygen saturation, and your weight (if you have the equipment to check those) and write them all down. When your visit starts, your provider will ask you for this information. If you have an Apple Watch or Kardia device, please plan to have heart rate information ready on the day of your appointment. Please have a pen and paper handy nearby the day of the visit as well."  5. Give patient instructions for MyChart download to smartphone OR Doximity/Doxy.me as below if video visit (depending on what platform provider is using)  6. Inform patient they will receive a phone call 15 minutes prior to their appointment time (may be from unknown caller ID) so they should be prepared to answer    TELEPHONE CALL NOTE  Jessica Ware KMQKMMN has been deemed a candidate for a follow-up tele-health visit to limit community exposure during the Covid-19 pandemic. I spoke with the patient via phone to ensure availability of phone/video source, confirm preferred email & phone number, and discuss  instructions and expectations.  I reminded Jessica Ware ZOXWRUE to be prepared with any vital sign and/or heart rhythm information that could potentially be obtained via home monitoring, at the time of her visit. I reminded Jessica Ware [id number] to expect a phone call prior to her visit.  Elliot Cousin, RMA 10/21/2018 12:34 PM   INSTRUCTIONS FOR DOWNLOADING THE MYCHART APP TO SMARTPHONE  - The patient must first make sure to have activated MyChart and know their login information - If Apple, go to [employer] and type in MyChart in the search  bar and download the app. If Android, ask patient to go to [employer] and type in [place] in the search bar and download the app. The app is free but as with any other app downloads, their phone may require them to verify saved payment information or Apple/Android password.  - The patient will need to then log into the app with their MyChart username and password, and select [facility] as their healthcare provider to link the account. When it is time for your visit, go to the MyChart app, find appointments, and click Begin Video Visit. Be sure to Select Allow for your device to access the Microphone and Camera for your visit. You will then be connected, and your provider will be with you shortly.  **If they have any issues connecting, or need assistance please contact MyChart service desk [phone])**  **If using a computer, in order to ensure the best quality for their visit they will need to use either of the following Internet Browsers: [employer], or Google Chrome**  IF USING DOXIMITY or DOXY.ME - The patient will receive a link just prior to their visit by text.     FULL LENGTH CONSENT FOR TELE-HEALTH VISIT   I hereby voluntarily request, consent and authorize [facility] and its employed or contracted physicians, physician assistants, nurse practitioners or other licensed health care professionals (the Practitioner), to provide me with telemedicine health care services (the "Services") as deemed necessary by the treating Practitioner. I acknowledge and consent to receive the Services by the Practitioner via telemedicine. I understand that the telemedicine visit will involve communicating with the Practitioner through live audiovisual communication technology and the disclosure of certain medical information by electronic transmission. I acknowledge that I have been given the opportunity to request an in-person assessment or other available alternative prior to the  telemedicine visit and am voluntarily participating in the telemedicine visit.  I understand that I have the right to withhold or withdraw my consent to the use of telemedicine in the course of my care at any time, without affecting my right to future care or treatment, and that the Practitioner or I may terminate the telemedicine visit at any time. I understand that I have the right to inspect all information obtained and/or recorded in the course of the telemedicine visit and may receive copies of available information for a reasonable fee.  I understand that some of the potential risks of receiving the Services via telemedicine include:  Marland Kitchen Delay or interruption in medical evaluation due to technological equipment failure or disruption; . Information transmitted may not be sufficient (e.g. poor resolution of images) to allow for appropriate medical decision making by the Practitioner; and/or  . In rare instances, security protocols could fail, causing a breach of personal health information.  Furthermore, I acknowledge that it is my responsibility to provide information about my medical history, conditions and care that is complete and accurate  to the best of my ability. I acknowledge that Practitioner's advice, recommendations, and/or decision may be based on factors not within their control, such as incomplete or inaccurate data provided by me or distortions of diagnostic images or specimens that may result from electronic transmissions. I understand that the practice of medicine is not an exact science and that Practitioner makes no warranties or guarantees regarding treatment outcomes. I acknowledge that I will receive a copy of this consent concurrently upon execution via email to the email address I last provided but may also request a printed copy by calling the office of [facility].    I understand that my insurance will be billed for this visit.   I have read or had this consent read to me.  . I understand the contents of this consent, which adequately explains the benefits and risks of the Services being provided via telemedicine.  . I have been provided ample opportunity to ask questions regarding this consent and the Services and have had my questions answered to my satisfaction. . I give my informed consent for the services to be provided through the use of telemedicine in my medical care  By participating in this telemedicine visit I agree to the above.

## 2018-10-26 ENCOUNTER — Telehealth (INDEPENDENT_AMBULATORY_CARE_PROVIDER_SITE_OTHER): Payer: Medicare Other | Admitting: Physician Assistant

## 2018-10-26 ENCOUNTER — Other Ambulatory Visit: Payer: Self-pay

## 2018-10-26 ENCOUNTER — Encounter: Payer: Self-pay | Admitting: Physician Assistant

## 2018-10-26 ENCOUNTER — Telehealth: Payer: Self-pay | Admitting: Physician Assistant

## 2018-10-26 VITALS — BP 132/59 | HR 72 | Temp 96.1°F | Ht 62.0 in | Wt 149.0 lb

## 2018-10-26 DIAGNOSIS — L659 Nonscarring hair loss, unspecified: Secondary | ICD-10-CM

## 2018-10-26 DIAGNOSIS — I10 Essential (primary) hypertension: Secondary | ICD-10-CM

## 2018-10-26 DIAGNOSIS — R635 Abnormal weight gain: Secondary | ICD-10-CM

## 2018-10-26 DIAGNOSIS — Z79899 Other long term (current) drug therapy: Secondary | ICD-10-CM

## 2018-10-26 DIAGNOSIS — Z7189 Other specified counseling: Secondary | ICD-10-CM

## 2018-10-26 DIAGNOSIS — E785 Hyperlipidemia, unspecified: Secondary | ICD-10-CM

## 2018-10-26 NOTE — Progress Notes (Signed)
Virtual Visit via Telephone Note   This visit type was conducted due to national recommendations for restrictions regarding the COVID-19 Pandemic (e.g. social distancing) in an effort to limit this patient's exposure and mitigate transmission in our community.  Due to her co-morbid illnesses, this patient is at least at moderate risk for complications without adequate follow up.  This format is felt to be most appropriate for this patient at this time.  The patient did not have access to video technology/had technical difficulties with video requiring transitioning to audio format only (telephone).  All issues noted in this document were discussed and addressed.  No physical exam could be performed with this format.  Please refer to the patient's chart for her  consent to tele[facility].   Evaluation Performed:  Follow-up visit  Date:  10/26/2018   ID:  Jessica Ware, DOB 1935/10/26, MRN [id number]  Patient Location: Home Provider Location: Home  PCP:  Cloward, Laban Emperoravis L, MD  Cardiologist:  Tonny BollmanMichael Cooper, MD  Electrophysiologist:  None   Chief Complaint:  Yearly follow up  History of Present Illness:    Jessica Ware is a 83 y.o. female with hx of HTN and HLD seen for follow up.   No prior cardiac hx. Last seen by Dr. Excell Seltzerooper 09/2017.  The patient does not have symptoms concerning for COVID-19 infection (fever, chills, cough, or new shortness of breath).    Patient denies chest pain, shortness of breath, palpitation, dizziness, orthopnea, PND, syncope, lower extremity edema or melena.  She is trying to be active doing yard work and some walking.  Her only complaint is hair loss and weight gain.  She is worried about her thyroid function test.  She wants to do her regular labs with TSH as soon as possible.  Discussed COVID-19 restriction however patient and adamant about doing lab.    Past Medical History:  Diagnosis Date  . Allergic rhinitis   .  Anxiety   . Depression    pt denies  . GERD (gastroesophageal reflux disease)   . History of renal stone   . Hyperlipidemia   . Hypertension   . Obesity   . Status post dilation of esophageal narrowing    Past Surgical History:  Procedure Laterality Date  . ABDOMINAL HYSTERECTOMY    . KIDNEY STONE SURGERY     1985     Current Meds  Medication Sig  . acetaminophen (TYLENOL) 500 MG tablet Take 500 mg by mouth as needed for pain.  Marland Kitchen. amLODipine (NORVASC) 2.5 MG tablet Take 2.5 mg by mouth daily.  Marland Kitchen. aspirin 81 MG tablet Take 81 mg by mouth daily.   . Cholecalciferol (VITAMIN D3) 50 MCG (2000 UT) capsule Take 2,000 Units by mouth daily.  Marland Kitchen. Cod Liver Oil CAPS Take by mouth. Once per day  . Coenzyme Q-10 100 MG capsule Take 100 mg by mouth daily.  . Glucosamine-Chondroitin 500-400 MG CAPS Take by mouth. Use as directed  . ibuprofen (ADVIL,MOTRIN) 200 MG tablet Take 200 mg by mouth as needed for pain.  Marland Kitchen. lisinopril-hydrochlorothiazide (PRINZIDE,ZESTORETIC) 20-12.5 MG tablet Take 1 tablet by mouth daily.  . pravastatin (PRAVACHOL) 20 MG tablet TAKE ONE TABLET BY MOUTH EVERY DAY  . [DISCONTINUED] amLODipine (NORVASC) 2.5 MG tablet Take 1 tablet (2.5 mg total) by mouth 2 (two) times daily.  . [DISCONTINUED] VITAMIN D, CHOLECALCIFEROL, PO Take 5,000 Units by mouth daily.     Allergies:   Prednisone; Penicillins; Sulfa antibiotics; Sulfonamide  derivatives; Sulfamethoxazole; and Tramadol   Social History   Tobacco Use  . Smoking status: Never Smoker  . Smokeless tobacco: Never Used  Substance Use Topics  . Alcohol use: No  . Drug use: No     Family Hx: The patient's family history includes Heart disease in her father; Heart disease (age of onset: 70) in her mother; Hypertension in her mother; Kidney disease in her father; Prostate cancer in her father.  ROS:   Please see the history of present illness.    All other systems reviewed and are negative.   Prior CV studies:   The  following studies were reviewed today:  As above  Labs/Other Tests and Data Reviewed:    EKG:  No ECG reviewed.  Recent Labs: 11/19/2017: ALT 13   Recent Lipid Panel Lab Results  Component Value Date/Time   CHOL 180 11/19/2017 02:10 PM   TRIG 51 11/19/2017 02:10 PM   HDL 73 11/19/2017 02:10 PM   CHOLHDL 2.5 11/19/2017 02:10 PM   CHOLHDL 3.8 02/26/2016 01:32 PM   LDLCALC 97 11/19/2017 02:10 PM   LDLDIRECT 112.1 07/03/2011 10:46 AM    Wt Readings from Last 3 Encounters:  10/26/18 149 lb (67.6 kg)  10/12/17 153 lb (69.4 kg)  02/19/16 150 lb 12.8 oz (68.4 kg)     Objective:    Vital Signs:  BP (!) 132/59   Pulse 72   Temp (!) 96.1 F (35.6 C)   Ht 5\' 2"  (1.575 m)   Wt 149 lb (67.6 kg)   BMI 27.25 kg/m    VITAL SIGNS:  reviewed GEN:  no acute distress, [facility] PSYCH:  normal affect  ASSESSMENT & PLAN:    1. HTN -Blood pressure stable on current medication.  No change  2. HLD -Continue statin.  Check labs.  3.  Hair loss and weight gain -Check TSH.  Denies anxiety.  Lives with husband.  No excessive eating.  COVID-19 Education: The signs and symptoms of COVID-19 were discussed with the patient and how to seek care for testing (follow up with PCP or arrange E-visit).  The importance of social distancing was discussed today.  Time:   Today, I have spent 20 minutes with the patient with telehealth technology discussing the above problems.     Medication Adjustments/Labs and Tests Ordered: Current medicines are reviewed at length with the patient today.  Concerns regarding medicines are outlined above.   Tests Ordered: Orders Placed This Encounter  Procedures  . Lipid panel  . TSH  . Comprehensive metabolic panel    Medication Changes: No orders of the defined types were placed in this encounter.   Disposition:  Follow up in 1 year(s)  Signed, Manson Passey, PA  10/26/2018 11:52 AM    [facility]

## 2018-10-26 NOTE — Telephone Encounter (Signed)
Virtual Visit Pre-Appointment Phone Call  "(Ms. Jessica Ware), I am calling you today to discuss your upcoming appointment. We are currently trying to limit exposure to the virus that causes COVID-19 by seeing patients at home rather than in the office."  1. "What is the BEST phone number to call the day of the visit?" - include this in appointment notes  2. Do you have or have access to (through a family member/friend) a smartphone with video capability that we can use for your visit?"   Confirm consent - "In the setting of the current Covid19 crisis, you are scheduled for a (phone or video) visit with your provider on (date) at (time).  Just as we do with many in-office visits, in order for you to participate in this visit, we must obtain consent.  If you'd like, I can send this to your mychart (if signed up) or email for you to review.  Otherwise, I can obtain your verbal consent now.  All virtual visits are billed to your insurance company just like a normal visit would be.  By agreeing to a virtual visit, we'd like you to understand that the technology does not allow for your provider to perform an examination, and thus may limit your provider's ability to fully assess your condition. If your provider identifies any concerns that need to be evaluated in person, we will make arrangements to do so.  Finally, though the technology is pretty good, we cannot assure that it will always work on either your or our end, and in the setting of a video visit, we may have to convert it to a phone-only visit.  In either situation, we cannot ensure that we have a secure connection.  Are you willing to proceed?"  Patient agreed.  3. Advise patient to be prepared - "Two hours prior to your appointment, go ahead and check your blood pressure, pulse, oxygen saturation, and your weight (if you have the equipment to check those) and write them all down. When your visit starts, your provider will ask you for this  information. If you have an Apple Watch or Kardia device, please plan to have heart rate information ready on the day of your appointment. Please have a pen and paper handy nearby the day of the visit as well."  4. Give patient instructions for MyChart download to smartphone OR [email or web address] as below if video visit (depending on what platform provider is using)  5. Inform patient they will receive a phone call 15 minutes prior to their appointment time (may be from unknown caller ID) so they should be prepared to answer    TELEPHONE CALL NOTE  Jessica Ware has been deemed a candidate for a follow-up tele-health visit to limit community exposure during the Covid-19 pandemic. I spoke with the patient via phone to ensure availability of phone/video source, confirm preferred email & phone number, and discuss instructions and expectations.  I reminded Jessica Ware to be prepared with any vital sign and/or heart rhythm information that could potentially be obtained via home monitoring, at the time of her visit. I reminded Jessica Ware to expect a phone call prior to her visit.  Jessica Ware 10/26/2018 10:41 AM   INSTRUCTIONS FOR DOWNLOADING THE MYCHART APP TO SMARTPHONE  - The patient must first make sure to have activated MyChart and know their login information - If Apple, go to [employer] and type in MyChart in the search bar and download the app.  If Android, ask patient to go to [employer] Play Store and type in [place]Chart in the search bar and download the app. The app is free but as with any other app downloads, their phone may require them to verify saved payment information or Apple/Android password.  - The patient will need to then log into the app with their MyChart username and password, and select [facility] as their healthcare provider to link the account. When it is time for your visit, go to the MyChart app, find appointments, and click Begin Video Visit.  Be sure to Select Allow for your device to access the Microphone and Camera for your visit. You will then be connected, and your provider will be with you shortly.     FULL LENGTH CONSENT FOR TELE-HEALTH VISIT   I hereby voluntarily request, consent and authorize [facility] and its employed or contracted physicians, physician assistants, nurse practitioners or other licensed health care professionals (the Practitioner), to provide me with telemedicine health care services (the Services") as deemed necessary by the treating Practitioner. I acknowledge and consent to receive the Services by the Practitioner via telemedicine. I understand that the telemedicine visit will involve communicating with the Practitioner through live audiovisual communication technology and the disclosure of certain medical information by electronic transmission. I acknowledge that I have been given the opportunity to request an in-person assessment or other available alternative prior to the telemedicine visit and am voluntarily participating in the telemedicine visit.  I understand that I have the right to withhold or withdraw my consent to the use of telemedicine in the course of my care at any time, without affecting my right to future care or treatment, and that the Practitioner or I may terminate the telemedicine visit at any time. I understand that I have the right to inspect all information obtained and/or recorded in the course of the telemedicine visit and may receive copies of available information for a reasonable fee.  I understand that some of the potential risks of receiving the Services via telemedicine include:   Delay or interruption in medical evaluation due to technological equipment failure or disruption;  Information transmitted may not be sufficient (e.g. poor resolution of images) to allow for appropriate medical decision making by the Practitioner; and/or   In rare instances, security protocols  could fail, causing a breach of personal health information.  Furthermore, I acknowledge that it is my responsibility to provide information about my medical history, conditions and care that is complete and accurate to the best of my ability. I acknowledge that Practitioner's advice, recommendations, and/or decision may be based on factors not within their control, such as incomplete or inaccurate data provided by me or distortions of diagnostic images or specimens that may result from electronic transmissions. I understand that the practice of medicine is not an exact science and that Practitioner makes no warranties or guarantees regarding treatment outcomes. I acknowledge that I will receive a copy of this consent concurrently upon execution via email to the email address I last provided but may also request a printed copy by calling the office of [facility].    I understand that my insurance will be billed for this visit.   I have read or had this consent read to me.  I understand the contents of this consent, which adequately explains the benefits and risks of the Services being provided via telemedicine.   I have been provided ample opportunity to ask questions regarding this consent and the  Services and have had my questions answered to my satisfaction.  I give my informed consent for the services to be provided through the use of telemedicine in my medical care  By participating in this telemedicine visit I agree to the above.

## 2018-10-26 NOTE — Patient Instructions (Signed)
Medication Instructions:  Your physician recommends that you continue on your current medications as directed. Please refer to the Current Medication list given to you today. If you need a refill on your cardiac medications before your next appointment, please call your pharmacy.   Lab work: TO BE DONE DONE SOON: LIPIDS, CMET, TSH  If you have labs (blood work) drawn today and your tests are completely normal, you will receive your results only by: Marland Kitchen MyChart Message (if you have MyChart) OR . A paper copy in the mail If you have any lab test that is abnormal or we need to change your treatment, we will call you to review the results.  Testing/Procedures: NONE  Follow-Up: At [facility], you and your health needs are our priority.  As part of our continuing mission to provide you with exceptional heart care, we have created designated Provider Care Teams.  These Care Teams include your primary Cardiologist (physician) and Advanced Practice Providers (APPs -  Physician Assistants and Nurse Practitioners) who all work together to provide you with the care you need, when you need it. You will need a follow up appointment in:  12 months.  Please call our office 2 months in advance to schedule this appointment.  You may see Tonny Bollman, MD or one of the following Advanced Practice Providers on your designated Care Team: Tereso Newcomer, PA-C [place], [place] . Berton Bon, NP  Any Other Special Instructions Will Be Listed Below (If Applicable).

## 2018-11-04 ENCOUNTER — Other Ambulatory Visit: Payer: Medicare Other | Admitting: *Deleted

## 2018-11-04 ENCOUNTER — Other Ambulatory Visit: Payer: Self-pay

## 2018-11-04 DIAGNOSIS — R635 Abnormal weight gain: Secondary | ICD-10-CM

## 2018-11-04 DIAGNOSIS — L659 Nonscarring hair loss, unspecified: Secondary | ICD-10-CM

## 2018-11-04 DIAGNOSIS — E785 Hyperlipidemia, unspecified: Secondary | ICD-10-CM

## 2018-11-04 DIAGNOSIS — I10 Essential (primary) hypertension: Secondary | ICD-10-CM

## 2018-11-05 LAB — COMPREHENSIVE METABOLIC PANEL
ALT: 13 IU/L (ref 0–32)
AST: 18 IU/L (ref 0–40)
Albumin/Globulin Ratio: 1.7 (ref 1.2–2.2)
Albumin: 4.1 g/dL (ref 3.7–4.7)
Alkaline Phosphatase: 113 IU/L (ref 39–117)
BUN/Creatinine Ratio: 28 (ref 12–28)
BUN: 21 mg/dL (ref 8–27)
Bilirubin Total: 1 mg/dL (ref 0.0–1.2)
CO2: 26 mmol/L (ref 20–29)
Calcium: 9.6 mg/dL (ref 8.7–10.3)
Chloride: 100 mmol/L (ref 96–106)
Creatinine, Ser: 0.76 mg/dL (ref 0.57–1.00)
GFR calc Af Amer: 86 mL/min/{1.73_m2} (ref >59)
GFR calc non Af Amer: 74 mL/min/{1.73_m2} (ref >59)
Globulin, Total: 2.4 g/dL (ref 1.5–4.5)
Glucose: 99 mg/dL (ref 65–99)
Potassium: 4.4 mmol/L (ref 3.5–5.2)
Sodium: 139 mmol/L (ref 134–144)
Total Protein: 6.5 g/dL (ref 6.0–8.5)

## 2018-11-05 LAB — LIPID PANEL
Chol/HDL Ratio: 3.4 ratio (ref 0.0–4.4)
Cholesterol, Total: 219 mg/dL — ABNORMAL HIGH (ref 100–199)
HDL: 64 mg/dL (ref >39)
LDL Calculated: 140 mg/dL — ABNORMAL HIGH (ref 0–99)
Triglycerides: 73 mg/dL (ref 0–149)
VLDL Cholesterol Cal: 15 mg/dL (ref 5–40)

## 2018-11-05 LAB — TSH: TSH: 2.28 u[IU]/mL (ref 0.450–4.500)

## 2018-11-08 ENCOUNTER — Telehealth: Payer: Self-pay | Admitting: Cardiovascular Disease

## 2018-11-08 NOTE — Telephone Encounter (Signed)
New Message   Patient is calling because she received a call earlier in reference to her labs. She wanted to make the nurse and Dr. Excell Seltzer aware that prior to her lab she had been experiencing constipation and palpitations. She is not sure if that would had any affect on her labs.

## 2018-11-08 NOTE — Telephone Encounter (Signed)
Returned pts call.  Left a message for pt to call back  

## 2018-11-11 NOTE — Telephone Encounter (Signed)
New Message  Please give patient call.

## 2018-11-11 NOTE — Telephone Encounter (Signed)
Returned pts call.  She has been made aware that the palpitations and constipation wouldn't cause her increase in her cholesterol.  Pt thanked me for the call.

## 2018-11-11 NOTE — Telephone Encounter (Signed)
-----   Message from [place], [place] sent at 11/08/2018 12:53 PM EDT ----- Change to Lipitor 20mg  daily. Repeat labs in 8 weeks ----- Message ----- From: Elliot Cousin, RMA Sent: 11/08/2018   9:52 AM EDT To: Manson Passey, PA  Pt states that she is taking her medication daily and her diet hasn't changed.

## 2018-11-30 ENCOUNTER — Other Ambulatory Visit: Payer: Medicare Other

## 2019-01-14 ENCOUNTER — Other Ambulatory Visit: Payer: Self-pay | Admitting: Cardiovascular Disease

## 2019-01-17 ENCOUNTER — Other Ambulatory Visit: Payer: Medicare Other

## 2019-01-17 ENCOUNTER — Telehealth: Payer: Self-pay | Admitting: Physician Assistant

## 2019-01-17 NOTE — Telephone Encounter (Signed)
Patient calling in reference to her medication, she wouldn't give any further information.

## 2019-01-17 NOTE — Telephone Encounter (Signed)
Jessica Ware states she had labs drawn at [facility] on [address] recently and her LDL was 107 (improved from May 2020 at 140). She wants to know if her labs need to be redrawn as scheduled at [facility] in a couple weeks.  Informed her that appointment will be cancelled for now and the labs will be requested from [facility].  She understands she will be called with Vin's recommendations after labs are received.

## 2019-01-19 NOTE — Telephone Encounter (Signed)
Labs received and given to Medical Records to scan in and send to B. Bhagat, PA.

## 2019-02-15 ENCOUNTER — Other Ambulatory Visit: Payer: Medicare Other

## 2019-07-14 ENCOUNTER — Telehealth: Payer: Self-pay | Admitting: Cardiovascular Disease

## 2019-07-14 NOTE — Telephone Encounter (Signed)
Patient returned called

## 2019-07-14 NOTE — Telephone Encounter (Signed)
Left message to call back  

## 2019-07-14 NOTE — Telephone Encounter (Signed)
Pt c/o Shortness Of Breath: STAT if SOB developed within the last 24 hours or pt is noticeably SOB on the phone  1. Are you currently SOB (can you hear that pt is SOB on the phone)? no  2. How long have you been experiencing SOB? 2 weeks  3. Are you SOB when sitting or when up moving around? worse when she lays down, ibuprofen helps  4. Are you currently experiencing any other symptoms? No  Patient states she has been having SOB for the past 2 weeks. She has an appointment 1/20 with Tereso Newcomer, but would like to have a sooner appointment if possible. She states she has an appointemnt today with her PCP, but will be back home by 12 and can be called then or her husband Jessica Ware can be called at anytime at [phone].

## 2019-07-14 NOTE — Telephone Encounter (Signed)
The patient states she has had a dry cough and SOB for about 1 week.  It is relieved when she takes ibuprofen. She takes 1 ibuprofen at night and she is able to lie flat to sleep at night. She takes 1 in the morning and she gets no SOB during the day. She states she feels like she is "drowning" trying to get oxygen. She went to her PCP today and they addressed issues such as restless legs, fluid in ears, and weight. They did an EKG and told her to follow-up with Cardiology for further testing. She states her VS are fine and she has no fevers. She states she knows it isn't COVID or an infection. She did much research with the [facility] and believes her heart is the cause of her issues. Informed the patient her symptoms were reviewed with Dr. Elease Hashimoto and instructed her to call her PCP to review symptoms again as ibuprofen alleviating symptoms does not indicate cardiac issue. She states her doctor today did not indicate she could have an infection and told her to see Cardiology. She understands records will be requested tomorrow from [facility] for review. She was grateful for assistance.

## 2019-07-14 NOTE — Telephone Encounter (Signed)
Attempted to call patient. Left message that she will be called again this afternoon.

## 2019-07-17 ENCOUNTER — Other Ambulatory Visit: Payer: Self-pay

## 2019-07-17 ENCOUNTER — Emergency Department (HOSPITAL_COMMUNITY): Payer: Medicare Other

## 2019-07-17 ENCOUNTER — Observation Stay (HOSPITAL_COMMUNITY)
Admission: EM | Admit: 2019-07-17 | Discharge: 2019-07-18 | Disposition: A | Discharge status: Home / Self Care | Payer: Medicare Other | Attending: Cardiology | Admitting: Cardiology

## 2019-07-17 ENCOUNTER — Encounter (HOSPITAL_COMMUNITY): Payer: Self-pay

## 2019-07-17 DIAGNOSIS — Z8249 Family history of ischemic heart disease and other diseases of the circulatory system: Secondary | ICD-10-CM | POA: Insufficient documentation

## 2019-07-17 DIAGNOSIS — I340 Nonrheumatic mitral (valve) insufficiency: Secondary | ICD-10-CM | POA: Insufficient documentation

## 2019-07-17 DIAGNOSIS — Z841 Family history of disorders of kidney and ureter: Secondary | ICD-10-CM | POA: Insufficient documentation

## 2019-07-17 DIAGNOSIS — I700 Atherosclerosis of aorta: Secondary | ICD-10-CM | POA: Diagnosis not present

## 2019-07-17 DIAGNOSIS — I509 Heart failure, unspecified: Secondary | ICD-10-CM

## 2019-07-17 DIAGNOSIS — I160 Hypertensive urgency: Secondary | ICD-10-CM | POA: Diagnosis not present

## 2019-07-17 DIAGNOSIS — Z7982 Long term (current) use of aspirin: Secondary | ICD-10-CM | POA: Insufficient documentation

## 2019-07-17 DIAGNOSIS — K219 Gastro-esophageal reflux disease without esophagitis: Secondary | ICD-10-CM | POA: Insufficient documentation

## 2019-07-17 DIAGNOSIS — E785 Hyperlipidemia, unspecified: Secondary | ICD-10-CM | POA: Insufficient documentation

## 2019-07-17 DIAGNOSIS — Z20822 Contact with and (suspected) exposure to covid-19: Secondary | ICD-10-CM | POA: Insufficient documentation

## 2019-07-17 DIAGNOSIS — I110 Hypertensive heart disease with heart failure: Principal | ICD-10-CM | POA: Insufficient documentation

## 2019-07-17 DIAGNOSIS — Z888 Allergy status to other drugs, medicaments and biological substances status: Secondary | ICD-10-CM | POA: Insufficient documentation

## 2019-07-17 DIAGNOSIS — I493 Ventricular premature depolarization: Secondary | ICD-10-CM

## 2019-07-17 DIAGNOSIS — I5031 Acute diastolic (congestive) heart failure: Secondary | ICD-10-CM | POA: Insufficient documentation

## 2019-07-17 DIAGNOSIS — Z79899 Other long term (current) drug therapy: Secondary | ICD-10-CM | POA: Diagnosis not present

## 2019-07-17 DIAGNOSIS — Z882 Allergy status to sulfonamides status: Secondary | ICD-10-CM | POA: Diagnosis not present

## 2019-07-17 DIAGNOSIS — Z880 Allergy status to penicillin: Secondary | ICD-10-CM | POA: Diagnosis not present

## 2019-07-17 LAB — COMPREHENSIVE METABOLIC PANEL
ALT: 19 U/L (ref 0–44)
AST: 24 U/L (ref 15–41)
Albumin: 3.7 g/dL (ref 3.5–5.0)
Alkaline Phosphatase: 82 U/L (ref 38–126)
Anion gap: 12 (ref 5–15)
BUN: 23 mg/dL (ref 8–23)
CO2: 20 mmol/L — ABNORMAL LOW (ref 22–32)
Calcium: 9 mg/dL (ref 8.9–10.3)
Chloride: 106 mmol/L (ref 98–111)
Creatinine, Ser: 0.89 mg/dL (ref 0.44–1.00)
GFR calc Af Amer: >60 mL/min (ref >60)
GFR calc non Af Amer: >60 mL/min (ref >60)
Glucose, Bld: 115 mg/dL — ABNORMAL HIGH (ref 70–99)
Potassium: 4 mmol/L (ref 3.5–5.1)
Sodium: 138 mmol/L (ref 135–145)
Total Bilirubin: 1.3 mg/dL — ABNORMAL HIGH (ref 0.3–1.2)
Total Protein: 6.5 g/dL (ref 6.5–8.1)

## 2019-07-17 LAB — CBC WITH DIFFERENTIAL/PLATELET
Abs Immature Granulocytes: 0.04 10*3/uL (ref 0.00–0.07)
Basophils Absolute: 0.1 10*3/uL (ref 0.0–0.1)
Basophils Relative: 1 %
Eosinophils Absolute: 0.1 10*3/uL (ref 0.0–0.5)
Eosinophils Relative: 1 %
HCT: 37.1 % (ref 36.0–46.0)
Hemoglobin: 11.8 g/dL — ABNORMAL LOW (ref 12.0–15.0)
Immature Granulocytes: 1 %
Lymphocytes Relative: 18 %
Lymphs Abs: 1.5 10*3/uL (ref 0.7–4.0)
MCH: 29.7 pg (ref 26.0–34.0)
MCHC: 31.8 g/dL (ref 30.0–36.0)
MCV: 93.5 fL (ref 80.0–100.0)
Monocytes Absolute: 0.9 10*3/uL (ref 0.1–1.0)
Monocytes Relative: 11 %
Neutro Abs: 5.5 10*3/uL (ref 1.7–7.7)
Neutrophils Relative %: 68 %
Platelets: 262 10*3/uL (ref 150–400)
RBC: 3.97 MIL/uL (ref 3.87–5.11)
RDW: 13.5 % (ref 11.5–15.5)
WBC: 8 10*3/uL (ref 4.0–10.5)
nRBC: 0 % (ref 0.0–0.2)

## 2019-07-17 LAB — TROPONIN I (HIGH SENSITIVITY): Troponin I (High Sensitivity): 6 ng/L (ref <18)

## 2019-07-17 LAB — BRAIN NATRIURETIC PEPTIDE: B Natriuretic Peptide: 621.8 pg/mL — ABNORMAL HIGH (ref 0.0–100.0)

## 2019-07-17 MED ORDER — HEPARIN SODIUM (PORCINE) 5000 UNIT/ML IJ SOLN
5000.0000 [IU] | Freq: Three times a day (TID) | INTRAMUSCULAR | Status: DC
  Administered 2019-07-17 – 2019-07-18 (×2): 5000 [IU] via SUBCUTANEOUS
  Filled 2019-07-17 (×3): qty 1

## 2019-07-17 MED ORDER — ACETAMINOPHEN 325 MG PO TABS: 650.0000 mg | ORAL_TABLET | ORAL | Status: DC | PRN

## 2019-07-17 MED ORDER — SODIUM CHLORIDE 0.9% FLUSH
3.0000 mL | Freq: Two times a day (BID) | INTRAVENOUS | Status: DC
  Administered 2019-07-18: 3 mL via INTRAVENOUS

## 2019-07-17 MED ORDER — SODIUM CHLORIDE 0.9 % IV SOLN: 250.0000 mL | INTRAVENOUS | Status: DC | PRN

## 2019-07-17 MED ORDER — SODIUM CHLORIDE 0.9% FLUSH: 3.0000 mL | INTRAVENOUS | Status: DC | PRN

## 2019-07-17 MED ORDER — ONDANSETRON HCL 4 MG/2ML IJ SOLN: 4.0000 mg | Freq: Four times a day (QID) | INTRAMUSCULAR | Status: DC | PRN

## 2019-07-17 NOTE — ED Notes (Signed)
Patient pulse rate presents at 54 while the ECG heart rate presents at 100. Both values show good connections. ECG shows an irregular heart rhythm.

## 2019-07-17 NOTE — ED Triage Notes (Signed)
Arrives via EMS for shortness of breath for 2 weeks and hypertension. Patient is alert and oriented and stable at this time.

## 2019-07-17 NOTE — H&P (Signed)
Cardiology History & Physical    Patient ID: Rumor Sun [facility] MRN: [id number]; DOB: 03-Sep-1935   Admission date: 07/17/2019  Primary Care Provider: Guadlupe Spanish, MD Primary Cardiologist: Sherren Mocha, MD  Primary Electrophysiologist:  None   Chief Complaint:  dyspnea  Patient Profile:   Jessica Ware is a 84 y.o. female with HTN, HLD, GERD who presents with dyspnea.  History of Present Illness:   Patient reports approximately 2 weeks of mild shortness of breath.  Over the past 2 days it became more intense and began waking her up at night.  She also feels weak and has a mild dry cough.  She denies chest pain, fevers, chills, leg swelling, vomiting, or diarrhea.  In the ED vitals notable for initial blood pressure of 208/76, heart rate 100, 96% on room air.  Labs notable for BNP 620 with a normal troponin.  EKG notable for sinus rhythm with frequent PVCs in a pattern of bigeminy.  Chest x-ray with mild pulmonary edema.  Heart Pathway Score:      Past Medical History:  Diagnosis Date  . Allergic rhinitis   . Anxiety   . Depression    pt denies  . GERD (gastroesophageal reflux disease)   . History of renal stone   . Hyperlipidemia   . Hypertension   . Obesity   . Status post dilation of esophageal narrowing     Past Surgical History:  Procedure Laterality Date  . ABDOMINAL HYSTERECTOMY    . [place]     Medications Prior to Admission: Prior to Admission medications   Medication Sig Start Date End Date Taking? Authorizing Provider  acetaminophen (TYLENOL) 500 MG tablet Take 1,000 mg by mouth every 8 (eight) hours as needed (pain).    Yes [provider]  amLODipine (NORVASC) 2.5 MG tablet Take 2.5 mg by mouth daily.   Yes [provider]  aspirin 81 MG tablet Take 81 mg by mouth daily.    Yes [provider]  Cholecalciferol (VITAMIN D3) 50 MCG (2000 UT) capsule Take 2,000 Units by mouth daily.    Yes [provider]  Cod Liver Oil CAPS Take 1 capsule by mouth daily. Once per day    Yes [provider]  Coenzyme Q-10 100 MG capsule Take 100 mg by mouth daily.   Yes [provider]  ibuprofen (ADVIL,MOTRIN) 200 MG tablet Take 200 mg by mouth every 8 (eight) hours as needed (pain).    Yes [provider]  lisinopril-hydrochlorothiazide (ZESTORETIC) 20-12.5 MG tablet Take 1 tablet by mouth daily. 01/14/19  Yes Sherren Mocha, MD  simvastatin (ZOCOR) 40 MG tablet Take 40 mg by mouth at bedtime. 06/02/19  Yes [provider]     Allergies:    Allergies  Allergen Reactions  . Prednisone Shortness Of Breath  . Penicillins Rash    Did it involve swelling of the face/tongue/throat, SOB, or low BP? No Did it involve sudden or severe rash/hives, skin peeling, or any reaction on the inside of your mouth or nose? No Did you need to seek medical attention at a hospital or doctor's office? Yes When did it last happen?2016 If all above answers are "NO", may proceed with cephalosporin use.   . Sulfa Antibiotics Nausea Only  . Sulfonamide Derivatives Nausea Only  . Sulfamethoxazole Other (See Comments)    unknown  . Tramadol Nausea And Vomiting    Social History:   Social  History   Socioeconomic History  . Marital status: Married    Spouse name: Not on file  . Number of children: 3  . Years of education: Not on file  . Highest education level: Not on file  Occupational History  . Occupation: retired    Associate Professor: HOMEMAKER  Tobacco Use  . Smoking status: Never Smoker  . Smokeless tobacco: Never Used  Substance and Sexual Activity  . Alcohol use: No  . Drug use: No  . Sexual activity: Not on file  Other Topics Concern  . 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         Social Determinants of Health   Financial Resource Strain:   . Difficulty of Paying Living Expenses: Not on file  Food Insecurity:   . Worried About Programme researcher, broadcasting/film/video in the Last Year: Not on file  . Ran Out of Food in the Last Year: Not on file  Transportation Needs:   . Lack of Transportation (Medical): Not on file  . Lack of Transportation (Non-Medical): Not on file  Physical Activity:   . Days of Exercise per Week: Not on file  . Minutes of Exercise per Session: Not on file  Stress:   . Feeling of Stress : Not on file  Social Connections:   . Frequency of Communication with Friends and Family: Not on file  . Frequency of Social Gatherings with Friends and Family: Not on file  . Attends Religious Services: Not on file  . Active Member of Clubs or Organizations: Not on file  . Attends Banker Meetings: Not on file  . Marital Status: Not on file  Intimate Partner Violence:   . Fear of Current or Ex-Partner: Not on file  . Emotionally Abused: Not on file  . Physically Abused: Not on file  . Sexually Abused: Not on file     Family History:   The patient's family history includes Heart disease in her father; Heart disease (age of onset: 64) in her mother; Hypertension in her mother; Kidney disease in her father; Prostate cancer in her father.    ROS:  Please see the history of present illness.  All other ROS reviewed and negative.     Physical Exam/Data:   Vitals:   07/17/19 2130 07/17/19 2145 07/17/19 2200 07/17/19 2330  BP: (!) 164/68 (!) 154/62 (!) 164/68 (!) 159/58  Pulse: (!) 49 (!) 48 (!) 50 (!) 48  Resp: 20 (!) 22 (!) 22 (!) 23  Temp:      TempSrc:      SpO2: 95%   95%  Weight:      Height:       No intake or output data in the 24 hours ending 07/17/19 2335 Last 3 Weights 07/17/2019 10/26/2018 10/12/2017  Weight (lbs) 151 lb 149 lb 153 lb  Weight (kg) 68.493 kg 67.586 kg 69.4 kg     Body mass index is 26.75 kg/m.  Wt  Readings from Last 3 Encounters:  07/17/19 68.5 kg  10/26/18 67.6 kg  10/12/17 69.4 kg    Physical Exam: General: Well developed, well nourished, in no acute distress. Head: Normocephalic, atraumatic, sclera non-icteric, no xanthomas, nares are without discharge.  Neck: Negative for carotid  bruits. JVD not elevated. Lungs: Clear bilaterally to auscultation without wheezes, rales, or rhonchi. Breathing is unlabored. Heart: RRR with S1 S2. No murmurs, rubs, or gallops appreciated. Abdomen: Soft, non-tender, non-distended with normoactive bowel sounds. No hepatomegaly. No rebound/guarding. No obvious abdominal masses. Msk:  Strength and tone appear normal for age. Extremities: No clubbing or cyanosis. No edema.  Distal pedal pulses are 2+ and equal bilaterally. Neuro: Alert and oriented X 3. No focal deficit. No facial asymmetry. Moves all extremities spontaneously. Psych:  Responds to questions appropriately with a normal affect.    EKG:  The ECG that was done  was personally reviewed and demonstrates sinus rhythm with PVCs  Relevant CV Studies: None  Laboratory Data:  High Sensitivity Troponin:   Recent Labs  Lab 07/17/19 2045  TROPONINIHS 6      Cardiac EnzymesNo results for input(s): TROPONINI in the last 168 hours. No results for input(s): TROPIPOC in the last 168 hours.  Chemistry Recent Labs  Lab 07/17/19 2045  NA 138  K 4.0  CL 106  CO2 20*  GLUCOSE 115*  BUN 23  CREATININE 0.89  CALCIUM 9.0  GFRNONAA >60  GFRAA >60  ANIONGAP 12    Recent Labs  Lab 07/17/19 2045  PROT 6.5  ALBUMIN 3.7  AST 24  ALT 19  ALKPHOS 82  BILITOT 1.3*   Hematology Recent Labs  Lab 07/17/19 2045  WBC 8.0  RBC 3.97  HGB 11.8*  HCT 37.1  MCV 93.5  MCH 29.7  MCHC 31.8  RDW 13.5  PLT 262   BNP Recent Labs  Lab 07/17/19 2045  BNP 621.8*    DDimer No results for input(s): DDIMER in the last 168 hours.   Radiology/Studies:  DG Chest Port 1 View  Result Date:  07/17/2019 CLINICAL DATA:  Cough EXAM: PORTABLE CHEST 1 VIEW COMPARISON:  04/20/2006 FINDINGS: There are bilateral Kerley B lines. The heart size is borderline enlarged. Aortic calcifications are noted. There are bronchitic changes at the lung bases bilaterally. There is no pneumothorax. No significant pleural effusion. No acute osseous abnormality. There are old healed left-sided rib fractures. There are degenerative changes of both glenohumeral joints, right worse than left. IMPRESSION: 1. Findings suspicious for developing interstitial pulmonary edema. An atypical infectious process can have a similar appearance. 2. Borderline cardiomegaly. 3.  Aortic Atherosclerosis (ICD10-I70.0). Electronically Signed   By: Katherine Mantle M.D.   On: 07/17/2019 21:30    Assessment and Plan    1.  New onset heart failure Patient presents with symptoms suggestive of heart failure and found to have elevated BNP and pulmonary edema on chest x-ray.  We will plan to admit for diuresis and new heart failure evaluation.  Echo cardiogram is ordered.  No IV diuretics were given in the ED.  Etiology of heart failure is unclear, but frequent PVCs were noted on EKG as discussed below. -- echocardiogram -- IV lasix this morning  2.  Frequent PVCs Patient in bigeminy on presentation.  No history of PVCs.  Unclear if PVCs are result or cause of heart failure.  Will trial initiation of beta-blocker.  Further treatment depending on results of echo and response to beta-blocker. -- start metoprolol 25 mg BID   Severity of Illness: The appropriate patient status for this patient is INPATIENT. Inpatient status is judged to be reasonable and necessary in order to provide the required intensity of service to ensure the patient's safety. The patient's presenting symptoms, physical exam findings, and initial radiographic  and laboratory data in the context of their chronic comorbidities is felt to place them at high risk for further  clinical deterioration. Furthermore, it is not anticipated that the patient will be medically stable for discharge from the hospital within 2 midnights of admission. The following factors support the patient status of inpatient.   " The patient's presenting symptoms include dyspnea. " The worrisome physical exam findings include volume overload. " The initial radiographic and laboratory data are worrisome because of pulmonary edema. " The chronic co-morbidities include HTN.   * I certify that at the point of admission it is my clinical judgment that the patient will require inpatient hospital care spanning beyond 2 midnights from the point of admission due to high intensity of service, high risk for further deterioration and high frequency of surveillance required.*    For questions or updates, please contact [facility] Please consult [email or web address] for contact info under      Signed, Sehaj Mcenroe S, MD 07/17/2019, 11:35 PM

## 2019-07-17 NOTE — ED Provider Notes (Signed)
[facility] EMERGENCY DEPARTMENT Provider Note   CSN: [id number] Arrival date & time: 07/17/19  2019     History No chief complaint on file.   Jessica Ware OINOMVE is a 84 y.o. female.  HPI   This patient is a very pleasant 84 year old female with a known history of hyperlipidemia, hypertension and a history of acid reflux.  She denies any primary cardiac disease though she states her family members have a history of cardiac disease.  She is followed by Dr. Excell Seltzer with the cardiology service for her general health and hypertension with a yearly visit.  She states that over the last 2 weeks she has had some generalized shortness of breath, this was initially very mild however over the last 2 days it has become more intense, seems to be waking her up at night with a feeling like she cannot breathe when she is laying down as well as some shortness of breath with daily activities.  It states that it makes her feel weak.  She denies focal chest pain and states that she does have a mild dry cough but no fevers or chills, no swelling of the legs, no nausea or vomiting or diarrhea.  In fact she trends towards constipation.  She has been using some over-the-counter medications such as Robitussin with guaifenesin but denies tobacco use or other stimulant medications.  She arrives by paramedic transport after calling out for shortness of breath with a cough.  The paramedics report that the patient's only abnormal vital sign was her hypertension measured at 208/76.  The patient does endorse being compliant with her daily blood pressure medicines which she takes in the morning.  Past Medical History:  Diagnosis Date  . Allergic rhinitis   . Anxiety   . Depression    pt denies  . GERD (gastroesophageal reflux disease)   . History of renal stone   . Hyperlipidemia   . Hypertension   . Obesity   . Status post dilation of esophageal narrowing     Patient Active Problem List   Diagnosis Date Noted  . Stricture and stenosis of esophagus 01/05/2012  . Special screening for malignant neoplasms, colon 08/25/2011  . Myalgia 03/26/2011  . Irregular bowels 03/26/2011  . OTHER DYSPNEA AND RESPIRATORY ABNORMALITIES 06/11/2010  . SYMPTOMATIC MENOPAUSAL/FEMALE CLIMACTERIC STATES 10/25/2009  . FATIGUE 06/20/2009  . COUGH 06/20/2009  . Hyperlipidemia 03/06/2007  . OBESITY 03/06/2007  . ANXIETY 03/06/2007  . DEPRESSION 03/06/2007  . Essential hypertension 03/06/2007  . ALLERGIC RHINITIS 03/06/2007  . GERD 03/06/2007  . RENAL CALCULUS, HX OF 03/06/2007    Past Surgical History:  Procedure Laterality Date  . ABDOMINAL HYSTERECTOMY    . KIDNEY STONE SURGERY     1985     OB History   No obstetric history on file.     Family History  Problem Relation Age of Onset  . Heart disease Mother 44       MI  . Hypertension Mother   . Heart disease Father   . Kidney disease Father   . Prostate cancer Father     Social History   Tobacco Use  . Smoking status: Never Smoker  . Smokeless tobacco: Never Used  Substance Use Topics  . Alcohol use: No  . Drug use: No    Home Medications Prior to Admission medications   Medication Sig Start Date End Date Taking? Authorizing Provider  acetaminophen (TYLENOL) 500 MG tablet Take 500 mg by mouth as needed  for pain.    [provider]  amLODipine (NORVASC) 2.5 MG tablet Take 2.5 mg by mouth daily.    [provider]  aspirin 81 MG tablet Take 81 mg by mouth daily.     [provider]  Cholecalciferol (VITAMIN D3) 50 MCG (2000 UT) capsule Take 2,000 Units by mouth daily.    [provider]  [facility] Liver Oil CAPS Take by mouth. Once per day    [provider]  Coenzyme Q-10 100 MG capsule Take 100 mg by mouth daily.    [provider]  Glucosamine-Chondroitin 500-400 MG CAPS Take by mouth. Use as directed    [provider]  ibuprofen (ADVIL,MOTRIN) 200 MG tablet  Take 200 mg by mouth as needed for pain.    [provider]  lisinopril-hydrochlorothiazide (ZESTORETIC) 20-12.5 MG tablet Take 1 tablet by mouth daily. 01/14/19   Sherren Mocha, MD  pravastatin (PRAVACHOL) 20 MG tablet TAKE ONE TABLET BY MOUTH EVERY DAY 09/16/18   Sherren Mocha, MD    Allergies    Prednisone, Penicillins, Sulfa antibiotics, Sulfonamide derivatives, Sulfamethoxazole, and Tramadol  Review of Systems   Review of Systems  All other systems reviewed and are negative.   Physical Exam Updated Vital Signs BP (!) 164/68   Pulse (!) 50   Temp 98.3 F (36.8 C) (Oral)   Resp (!) 22   Ht 1.6 m (5\' 3" )   Wt 68.5 kg   SpO2 95%   BMI 26.75 kg/m   Physical Exam Vitals and nursing note reviewed.  Constitutional:      General: She is not in acute distress.    Appearance: She is well-developed.  HENT:     Head: Normocephalic and atraumatic.     Mouth/Throat:     Pharynx: No oropharyngeal exudate.  Eyes:     General: No scleral icterus.       Right eye: No discharge.        Left eye: No discharge.     Conjunctiva/sclera: Conjunctivae normal.     Pupils: Pupils are equal, round, and reactive to light.  Neck:     Thyroid: No thyromegaly.     Vascular: No JVD.  Cardiovascular:     Rate and Rhythm: Tachycardia present. Rhythm irregular.     Heart sounds: Normal heart sounds. No murmur. No friction rub. No gallop.   Pulmonary:     Effort: Pulmonary effort is normal. No respiratory distress.     Breath sounds: Normal breath sounds. No wheezing or rales.  Abdominal:     General: Bowel sounds are normal. There is no distension.     Palpations: Abdomen is soft. There is no mass.     Tenderness: There is no abdominal tenderness.  Musculoskeletal:        General: No tenderness. Normal range of motion.     Cervical back: Normal range of motion and neck supple.     Right lower leg: No edema.     Left lower leg: No edema.  Lymphadenopathy:     Cervical: No  cervical adenopathy.  Skin:    General: Skin is warm and dry.     Findings: No erythema or rash.  Neurological:     Mental Status: She is alert.     Coordination: Coordination normal.  Psychiatric:        Behavior: Behavior normal.     ED Results / Procedures / Treatments   Labs (all labs ordered are listed, but only  abnormal results are displayed) Labs Reviewed  CBC WITH DIFFERENTIAL/PLATELET - Abnormal; Notable for the following components:      Result Value   Hemoglobin 11.8 (*)    All other components within normal limits  COMPREHENSIVE METABOLIC PANEL - Abnormal; Notable for the following components:   CO2 20 (*)    Glucose, Bld 115 (*)    Total Bilirubin 1.3 (*)    All other components within normal limits  BRAIN NATRIURETIC PEPTIDE - Abnormal; Notable for the following components:   B Natriuretic Peptide 621.8 (*)    All other components within normal limits  TROPONIN I (HIGH SENSITIVITY)    EKG EKG Interpretation  Date/Time:  Sunday July 17 2019 20:31:33 EST Ventricular Rate:  118 PR Interval:    QRS Duration: 91 QT Interval:  305 QTC Calculation: 361 R Axis:   54 Text Interpretation: Sinus tachycardia Ventricular bigeminy Nonspecific repol abnormality, diffuse leads No old tracing to compare Confirmed by Eber Hong ([id number]) on 07/17/2019 8:39:49 PM   Radiology DG Chest Port 1 View  Result Date: 07/17/2019 CLINICAL DATA:  Cough EXAM: PORTABLE CHEST 1 VIEW COMPARISON:  04/20/2006 FINDINGS: There are bilateral Kerley B lines. The heart size is borderline enlarged. Aortic calcifications are noted. There are bronchitic changes at the lung bases bilaterally. There is no pneumothorax. No significant pleural effusion. No acute osseous abnormality. There are old healed left-sided rib fractures. There are degenerative changes of both glenohumeral joints, right worse than left. IMPRESSION: 1. Findings suspicious for developing interstitial pulmonary edema. An atypical  infectious process can have a similar appearance. 2. Borderline cardiomegaly. 3.  Aortic Atherosclerosis (ICD10-I70.0). Electronically Signed   By: Katherine Mantle M.D.   On: 07/17/2019 21:30    Procedures Procedures (including critical care time)  Medications Ordered in ED Medications - No data to display  ED Course  I have reviewed the triage vital signs and the nursing notes.  Pertinent labs & imaging results that were available during my care of the patient were reviewed by me and considered in my medical decision making (see chart for details).    MDM Rules/Calculators/A&P                      This patient's vital signs are remarkable only for the hypertension which is quite significant though improved on arrival down to 176/80.  She also has an irregular heartbeat though it appears to be related to frequent ventricular ectopy in a bigeminy pattern.  She does not have any JVD nor does she have peripheral edema or abnormal lung sounds to suggest pulmonary edema, she does not have significant rales or fever or hypoxia to suggest pneumonia or pulmonary edema either.  The patient is concerned about all of these things and even states she is worried she has a pulmonary embolism though she cannot tell me why.  She does not travel, has no swelling of the legs, and has very low risk for pulmonary embolism.  We will start with chest x-ray, cardiac monitoring, labs and EKG  8:41 PM Cardiac monitoring reveals sinus rhythm with overlying ventricular bigeminy (Rate & rhythm), as reviewed and interpreted by me. Cardiac monitoring was ordered due to monitor the patient's vital signs and to monitor patient for dysrhythmia.  This patient continues to be in sinus rhythm, I discussed her case with the cardiology provider who will admit her to the hospital.  She is agreeable to the plan  BNP is over 600, troponin is  negative.  This is suggestive of early congestive heart failure likely related to  hypertension given her significant hypertension.  Arushi Partridge [id number] was evaluated in Emergency Department on 07/17/2019 for the symptoms described in the history of present illness. She was evaluated in the context of the global COVID-19 pandemic, which necessitated consideration that the patient might be at risk for infection with the SARS-CoV-2 virus that causes COVID-19. Institutional protocols and algorithms that pertain to the evaluation of patients at risk for COVID-19 are in a state of rapid change based on information released by regulatory bodies including the CDC and federal and state organizations. These policies and algorithms were followed during the patient's care in the ED.    Final Clinical Impression(s) / ED Diagnoses Final diagnoses:  Acute congestive heart failure, unspecified heart failure type (HCC)      Eber Hong, MD 07/17/19 2254

## 2019-07-18 ENCOUNTER — Other Ambulatory Visit: Payer: Self-pay | Admitting: Cardiovascular Disease

## 2019-07-18 ENCOUNTER — Inpatient Hospital Stay (HOSPITAL_COMMUNITY): Payer: Medicare Other

## 2019-07-18 ENCOUNTER — Encounter (HOSPITAL_COMMUNITY): Payer: Self-pay | Admitting: Cardiology

## 2019-07-18 DIAGNOSIS — I5031 Acute diastolic (congestive) heart failure: Secondary | ICD-10-CM | POA: Diagnosis not present

## 2019-07-18 DIAGNOSIS — I493 Ventricular premature depolarization: Secondary | ICD-10-CM | POA: Diagnosis not present

## 2019-07-18 DIAGNOSIS — I340 Nonrheumatic mitral (valve) insufficiency: Secondary | ICD-10-CM

## 2019-07-18 DIAGNOSIS — I509 Heart failure, unspecified: Secondary | ICD-10-CM

## 2019-07-18 LAB — ECHOCARDIOGRAM COMPLETE
Height: 63 in
Weight: 2387.2 oz

## 2019-07-18 LAB — BASIC METABOLIC PANEL
Anion gap: 8 (ref 5–15)
BUN: 18 mg/dL (ref 8–23)
CO2: 25 mmol/L (ref 22–32)
Calcium: 8.9 mg/dL (ref 8.9–10.3)
Chloride: 108 mmol/L (ref 98–111)
Creatinine, Ser: 0.87 mg/dL (ref 0.44–1.00)
GFR calc Af Amer: >60 mL/min (ref >60)
GFR calc non Af Amer: >60 mL/min (ref >60)
Glucose, Bld: 108 mg/dL — ABNORMAL HIGH (ref 70–99)
Potassium: 4 mmol/L (ref 3.5–5.1)
Sodium: 141 mmol/L (ref 135–145)

## 2019-07-18 LAB — RESPIRATORY PANEL BY RT PCR (FLU A&B, COVID)
Influenza A by PCR: NEGATIVE
Influenza B by PCR: NEGATIVE
SARS Coronavirus 2 by RT PCR: NEGATIVE

## 2019-07-18 MED ORDER — METOPROLOL TARTRATE 25 MG PO TABS: 25.0000 mg | ORAL_TABLET | Freq: Two times a day (BID) | ORAL | 3 refills | Status: DC

## 2019-07-18 MED ORDER — FUROSEMIDE 10 MG/ML IJ SOLN
20.0000 mg | Freq: Once | INTRAMUSCULAR | Status: AC
  Administered 2019-07-18: 20 mg via INTRAVENOUS
  Filled 2019-07-18: qty 2

## 2019-07-18 MED ORDER — METOPROLOL TARTRATE 25 MG PO TABS
25.0000 mg | ORAL_TABLET | Freq: Two times a day (BID) | ORAL | Status: DC
  Administered 2019-07-18: 25 mg via ORAL
  Filled 2019-07-18: qty 1

## 2019-07-18 MED ORDER — AMLODIPINE BESYLATE 2.5 MG PO TABS
2.5000 mg | ORAL_TABLET | Freq: Every day | ORAL | Status: DC
  Administered 2019-07-18: 2.5 mg via ORAL
  Filled 2019-07-18: qty 1

## 2019-07-18 MED ORDER — ASPIRIN EC 81 MG PO TBEC
81.0000 mg | DELAYED_RELEASE_TABLET | Freq: Every day | ORAL | Status: DC
  Administered 2019-07-18: 81 mg via ORAL
  Filled 2019-07-18: qty 1

## 2019-07-18 MED ORDER — SIMVASTATIN 20 MG PO TABS: 40.0000 mg | ORAL_TABLET | Freq: Every day | ORAL | Status: DC

## 2019-07-18 MED ORDER — LISINOPRIL 20 MG PO TABS: 20.0000 mg | ORAL_TABLET | Freq: Every day | ORAL | 3 refills | Status: DC

## 2019-07-18 MED ORDER — FUROSEMIDE 20 MG PO TABS: 20.0000 mg | ORAL_TABLET | Freq: Every day | ORAL | Status: DC

## 2019-07-18 MED ORDER — FUROSEMIDE 20 MG PO TABS: 20.0000 mg | ORAL_TABLET | Freq: Every day | ORAL | 2 refills | Status: DC

## 2019-07-18 MED ORDER — LISINOPRIL 20 MG PO TABS
20.0000 mg | ORAL_TABLET | Freq: Every day | ORAL | Status: DC
  Administered 2019-07-18: 08:00:00 20 mg via ORAL
  Filled 2019-07-18: qty 1

## 2019-07-18 NOTE — Care Management (Signed)
Consult HF Patient to DC to home. Coveragethrough [employer] Medicare No HH, or medication needs identified.  Has PCP Karle Plumber, MD And Cardiology coverage Tonny Bollman, MD

## 2019-07-18 NOTE — Progress Notes (Signed)
DAILY PROGRESS NOTE   Patient Name: Jessica Ware [id number] Date of Encounter: 07/18/2019 Cardiologist: Tonny Bollman, MD  Chief Complaint   Breathing better  Patient Profile   Jessica Ware is a 84 y.o. female with HTN, HLD, GERD who presents with dyspnea.  Subjective   No diuresis recorded, however, weight is down to 67.7 from 68.5 kg. Personally reviewed echo today - LVEF 50-55%, frequent PVC's. She feels better - less palpitations. No chest pain. Anxious. Troponin negative. BNP was 621.  Objective   Vitals:   07/18/19 0200 07/18/19 0230 07/18/19 0320 07/18/19 0534  BP: 135/73 130/72 (!) 147/64 140/78  Pulse: 77 82 75 78  Resp: 19 16  18   Temp:   97.7 F (36.5 C) 98.1 F (36.7 C)  TempSrc:   Oral Oral  SpO2:  95% 97% 97%  Weight:   67.7 kg   Height:        Intake/Output Summary (Last 24 hours) at 07/18/2019 1137 Last data filed at 07/18/2019 0400 Gross per 24 hour  Intake 240 ml  Output --  Net 240 ml   Filed Weights   07/17/19 2030 07/18/19 0320  Weight: 68.5 kg 67.7 kg    Physical Exam   General appearance: alert and no distress Neck: no carotid bruit, no JVD and thyroid not enlarged, symmetric, no tenderness/mass/nodules Lungs: clear to auscultation bilaterally Heart: regular rate and rhythm Abdomen: soft, non-tender; bowel sounds normal; no masses,  no organomegaly Extremities: extremities normal, atraumatic, no cyanosis or edema Pulses: 2+ and symmetric Skin: Skin color, texture, turgor normal. No rashes or lesions Neurologic: Grossly normal Psych: Pleasant  Inpatient Medications    Scheduled Meds: . amLODipine  2.5 mg Oral Daily  . aspirin EC  81 mg Oral Daily  . furosemide  20 mg Oral Daily  . heparin  5,000 Units Subcutaneous Q8H  . lisinopril  20 mg Oral Daily  . metoprolol tartrate  25 mg Oral BID  . simvastatin  40 mg Oral q1800  . sodium chloride flush  3 mL Intravenous Q12H    Continuous Infusions: . sodium chloride       PRN Meds: sodium chloride, acetaminophen, ondansetron (ZOFRAN) IV, sodium chloride flush   Labs   Results for orders placed or performed during the hospital encounter of 07/17/19 (from the past 48 hour(s))  CBC with Differential     Status: Abnormal   Collection Time: 07/17/19  8:45 PM  Result Value Ref Range   WBC 8.0 4.0 - 10.5 K/uL   RBC 3.97 3.87 - 5.11 MIL/uL   Hemoglobin 11.8 (L) 12.0 - 15.0 g/dL   HCT 07/19/19 81.7 - 71.1 %   MCV 93.5 80.0 - 100.0 fL   MCH 29.7 26.0 - 34.0 pg   MCHC 31.8 30.0 - 36.0 g/dL   RDW 65.7 90.3 - 83.3 %   Platelets 262 150 - 400 K/uL   nRBC 0.0 0.0 - 0.2 %   Neutrophils Relative % 68 %   Neutro Abs 5.5 1.7 - 7.7 K/uL   Lymphocytes Relative 18 %   Lymphs Abs 1.5 0.7 - 4.0 K/uL   Monocytes Relative 11 %   Monocytes Absolute 0.9 0.1 - 1.0 K/uL   Eosinophils Relative 1 %   Eosinophils Absolute 0.1 0.0 - 0.5 K/uL   Basophils Relative 1 %   Basophils Absolute 0.1 0.0 - 0.1 K/uL   Immature Granulocytes 1 %   Abs Immature Granulocytes 0.04 0.00 - 0.07 K/uL  Comment: Performed at [facility], [address] [place], [place] [address]  CMP     Status: Abnormal   Collection Time: 07/17/19  8:45 PM  Result Value Ref Range   Sodium 138 135 - 145 mmol/L   Potassium 4.0 3.5 - 5.1 mmol/L   Chloride 106 98 - 111 mmol/L   CO2 20 (L) 22 - 32 mmol/L   Glucose, Bld 115 (H) 70 - 99 mg/dL   BUN 23 8 - 23 mg/dL   Creatinine, Ser 9.92 0.44 - 1.00 mg/dL   Calcium 9.0 8.9 - 42.6 mg/dL   Total Protein 6.5 6.5 - 8.1 g/dL   Albumin 3.7 3.5 - 5.0 g/dL   AST 24 15 - 41 U/L   ALT 19 0 - 44 U/L   Alkaline Phosphatase 82 38 - 126 U/L   Total Bilirubin 1.3 (H) 0.3 - 1.2 mg/dL   GFR calc non Af Amer >60 >60 mL/min   GFR calc Af Amer >60 >60 mL/min   Anion gap 12 5 - 15    Comment: Performed at [facility], [address] [place], [place] [address]  Troponin I (High Sensitivity)     Status: None   Collection Time: 07/17/19  8:45 PM  Result Value  Ref Range   Troponin I (High Sensitivity) 6 <18 ng/L    Comment: (NOTE) Elevated high sensitivity troponin I (hsTnI) values and significant  changes across serial measurements may suggest ACS but many other  chronic and acute conditions are known to elevate hsTnI results.  Refer to the Links section for chest pain algorithms and additional  guidance. Performed at [facility], [address], [place], [place] [address]   Brain natriuretic peptide     Status: Abnormal   Collection Time: 07/17/19  8:45 PM  Result Value Ref Range   B Natriuretic Peptide 621.8 (H) 0.0 - 100.0 pg/mL    Comment: Performed at [facility], [address] [place], [place] [address]  Respiratory Panel by RT PCR (Flu A&B, Covid) - Nasopharyngeal Swab     Status: None   Collection Time: 07/17/19 11:20 PM   Specimen: Nasopharyngeal Swab  Result Value Ref Range   SARS Coronavirus 2 by RT PCR NEGATIVE NEGATIVE    Comment: (NOTE) SARS-CoV-2 target nucleic acids are NOT DETECTED. The SARS-CoV-2 RNA is generally detectable in upper respiratoy specimens during the acute phase of infection. The lowest concentration of SARS-CoV-2 viral copies this assay can detect is 131 copies/mL. A negative result does not preclude SARS-Cov-2 infection and should not be used as the sole basis for treatment or other patient management decisions. A negative result may occur with  improper specimen collection/handling, submission of specimen other than nasopharyngeal swab, presence of viral mutation(s) within the areas targeted by this assay, and inadequate number of viral copies (<131 copies/mL). A negative result must be combined with clinical observations, patient history, and epidemiological information. The expected result is Negative. Fact Sheet for Patients:  https://www.moore.com/ Fact Sheet for Healthcare Providers:  https://www.young.biz/ This test is not yet ap proved or  cleared by the [place] FDA and  has been authorized for detection and/or diagnosis of SARS-CoV-2 by FDA under an Emergency Use Authorization (EUA). This EUA will remain  in effect (meaning this test can be used) for the duration of the COVID-19 declaration under Section 564(b)(1) of the Act, 21 U.S.C. section 360bbb-3(b)(1), unless the authorization is terminated or revoked sooner.  Influenza A by PCR NEGATIVE NEGATIVE   Influenza B by PCR NEGATIVE NEGATIVE    Comment: (NOTE) The Xpert Xpress SARS-CoV-2/FLU/RSV assay is intended as an aid in  the diagnosis of influenza from Nasopharyngeal swab specimens and  should not be used as a sole basis for treatment. Nasal washings and  aspirates are unacceptable for Xpert Xpress SARS-CoV-2/FLU/RSV  testing. Fact Sheet for Patients: PinkCheek.be Fact Sheet for Healthcare Providers: GravelBags.it This test is not yet approved or cleared by the [place] FDA and  has been authorized for detection and/or diagnosis of SARS-CoV-2 by  FDA under an Emergency Use Authorization (EUA). This EUA will remain  in effect (meaning this test can be used) for the duration of the  Covid-19 declaration under Section 564(b)(1) of the Act, 21  U.S.C. section 360bbb-3(b)(1), unless the authorization is  terminated or revoked. Performed at [facility], [place] [address], [place], [place] [address]   Basic metabolic panel     Status: Abnormal   Collection Time: 07/18/19  5:23 AM  Result Value Ref Range   Sodium 141 135 - 145 mmol/L   Potassium 4.0 3.5 - 5.1 mmol/L   Chloride 108 98 - 111 mmol/L   CO2 25 22 - 32 mmol/L   Glucose, Bld 108 (H) 70 - 99 mg/dL   BUN 18 8 - 23 mg/dL   Creatinine, Ser 0.87 0.44 - 1.00 mg/dL   Calcium 8.9 8.9 - 10.3 mg/dL   GFR calc non Af Amer >60 >60 mL/min   GFR calc Af Amer >60 >60 mL/min   Anion gap 8 5 - 15    Comment: Performed at [facility] [address], [place], [place] [address]    ECG    N/A  Telemetry   PVC's noted overnight, improved today, in sinus - Personally Reviewed  Radiology    DG Chest Port 1 View  Result Date: 07/17/2019 CLINICAL DATA:  Cough EXAM: PORTABLE CHEST 1 VIEW COMPARISON:  04/20/2006 FINDINGS: There are bilateral Kerley B lines. The heart size is borderline enlarged. Aortic calcifications are noted. There are bronchitic changes at the lung bases bilaterally. There is no pneumothorax. No significant pleural effusion. No acute osseous abnormality. There are old healed left-sided rib fractures. There are degenerative changes of both glenohumeral joints, right worse than left. IMPRESSION: 1. Findings suspicious for developing interstitial pulmonary edema. An atypical infectious process can have a similar appearance. 2. Borderline cardiomegaly. 3.  Aortic Atherosclerosis (ICD10-I70.0). Electronically Signed   By: Constance Holster M.D.   On: 07/17/2019 21:30   ECHOCARDIOGRAM COMPLETE  Result Date: 07/18/2019   ECHOCARDIOGRAM REPORT   Patient Name:   Jessica Ware Date of Exam: 07/18/2019 Medical Rec #:  [id number]              Height:       63.0 in Accession #:    [id number]             Weight:       149.2 lb Date of Birth:  [birth year]              BSA:          1.71 m Patient Age:    59 years               BP:           140/78 mmHg Patient Gender: F  HR:           78 bpm. Exam Location:  Inpatient Procedure: 2D Echo Indications:    425.9 cardiomyopathy  History:        Patient has no prior history of Echocardiogram examinations.                 Risk Factors:Hypertension and Dyslipidemia.  Sonographer:    Celene SkeenVijay Shankar RDCS (AE) Referring Phys: [phone] ADAM S BARNETT IMPRESSIONS  1. Left ventricular ejection fraction, by visual estimation, is 50 to 55%. The left ventricle has low normal function. There is no left ventricular hypertrophy.  2. Left ventricular diastolic parameters are  consistent with Grade I diastolic dysfunction (impaired relaxation).  3. The left ventricle has no regional wall motion abnormalities.  4. Global right ventricle has normal systolic function.The right ventricular size is normal. No increase in right ventricular wall thickness.  5. Left atrial size was normal.  6. Right atrial size was normal.  7. The mitral valve is grossly normal. Mild mitral valve regurgitation.  8. The tricuspid valve is grossly normal.  9. The aortic valve is tricuspid. Aortic valve regurgitation is not visualized. 10. The pulmonic valve was grossly normal. Pulmonic valve regurgitation is not visualized. 11. The inferior vena cava is normal in size with <50% respiratory variability, suggesting right atrial pressure of 8 mmHg. FINDINGS  Left Ventricle: Left ventricular ejection fraction, by visual estimation, is 50 to 55%. The left ventricle has low normal function. The left ventricle has no regional wall motion abnormalities. There is no left ventricular hypertrophy. Left ventricular diastolic parameters are consistent with Grade I diastolic dysfunction (impaired relaxation). Indeterminate filling pressures. Right Ventricle: The right ventricular size is normal. No increase in right ventricular wall thickness. Global RV systolic function is has normal systolic function. Left Atrium: Left atrial size was normal in size. Right Atrium: Right atrial size was normal in size Pericardium: There is no evidence of pericardial effusion. Mitral Valve: The mitral valve is grossly normal. Mild mitral valve regurgitation. Tricuspid Valve: The tricuspid valve is grossly normal. Tricuspid valve regurgitation is trivial. Aortic Valve: The aortic valve is tricuspid. Aortic valve regurgitation is not visualized. Pulmonic Valve: The pulmonic valve was grossly normal. Pulmonic valve regurgitation is not visualized. Pulmonic regurgitation is not visualized. Aorta: The aortic root and ascending aorta are structurally  normal, with no evidence of dilitation. Venous: The inferior vena cava is normal in size with less than 50% respiratory variability, suggesting right atrial pressure of 8 mmHg. IAS/Shunts: No atrial level shunt detected by color flow Doppler.  LEFT VENTRICLE PLAX 2D LVIDd:         3.99 cm  Diastology LVIDs:         2.87 cm  LV e' lateral:   11.50 cm/s LV PW:         0.91 cm  LV E/e' lateral: 8.0 LV IVS:        0.79 cm LVOT diam:     2.00 cm LV SV:         38 ml LV SV Index:   21.82 LVOT Area:     3.14 cm  LEFT ATRIUM             Index       RIGHT ATRIUM           Index LA diam:        3.30 cm 1.93 cm/m  RA Area:     13.50 cm LA Vol (A2C):  27.9 ml 16.34 ml/m RA Volume:   29.60 ml  17.34 ml/m LA Vol (A4C):   40.9 ml 23.96 ml/m LA Biplane Vol: 35.0 ml 20.50 ml/m  AORTIC VALVE LVOT Vmax:   43.70 cm/s LVOT Vmean:  34.100 cm/s LVOT VTI:    0.109 m  AORTA Ao Root diam: 2.80 cm MITRAL VALVE MV Area (PHT): 4.68 cm             SHUNTS MV PHT:        46.98 msec           Systemic VTI:  0.11 m MV Decel Time: 162 msec             Systemic Diam: 2.00 cm MV E velocity: 92.10 cm/s 103 cm/s MV A velocity: 85.70 cm/s 70.3 cm/s MV E/A ratio:  1.07       1.5  Zoila Shutter MD Electronically signed by Zoila Shutter MD Signature Date/Time: 07/18/2019/10:50:06 AM    Final     Cardiac Studies   See above  Assessment   Principal Problem:   Acute diastolic heart failure (HCC) Active Problems:   Frequent PVCs   Plan   1. Jessica Ware has a low normal LVEF of echo with frequent PVC's - they are improved with metoprolol, as is her BP. She is quite anxious. I tried to reassure her. She is breathing better with IV lasix. Will continue lasix 20 mg daily at home. Continue metoprolol which is supressing her PVC's. Would recommend outpatient ischemia evaluation since trops are negative here. She has a follow-up appt with Tereso Newcomer, PA-C on Wednesday. Ok for d/c home today.  Time Spent Directly with Patient:  I have  spent a total of 25 minutes with the patient reviewing hospital notes, telemetry, EKGs, labs and examining the patient as well as establishing an assessment and plan that was discussed personally with the patient.  > 50% of time was spent in direct patient care.  Length of Stay:  LOS: 1 day   Chrystie Nose, MD, [facility], FACP  [place]  [facility]  Medical Director of the [facility] Lipid [facility]  Cardiovascular Risk Reduction Clinic Diplomate of the American Board of Clinical Lipidology Attending Cardiologist  Direct Dial: [phone]  Fax: [phone]  Website:  [email or web address] Nicely Marvelene Stoneberg 07/18/2019, 11:37 AM

## 2019-07-18 NOTE — Care Management Obs Status (Signed)
MEDICARE OBSERVATION STATUS NOTIFICATION   Patient Details  Name: Nekita Pita BXUXYBF MRN: [id number] Date of Birth: [birth year]   Medicare Observation Status Notification Given:  Yes    Lawerance Sabal, RN 07/18/2019, 2:09 PM

## 2019-07-18 NOTE — Discharge Summary (Addendum)
Discharge Summary    Patient ID: Jessica Ware [facility] MRN: [id number]; DOB: [birth year]  Admit date: 07/17/2019 Discharge date: 07/18/2019  Primary Care Provider: Karle Plumber, MD  Primary Cardiologist: Jessica Bollman, MD   Discharge Diagnoses    Active Problems:   Acute diastolic CHF   Hypertensive urgency   HLD   PVCs  Diagnostic Studies/Procedures    Echo 07/18/19 1. Left ventricular ejection fraction, by visual estimation, is 50 to 55%. The left ventricle has low normal function. There is no left ventricular hypertrophy.  2. Left ventricular diastolic parameters are consistent with Grade I diastolic dysfunction (impaired relaxation).  3. The left ventricle has no regional wall motion abnormalities.  4. Global right ventricle has normal systolic function.The right ventricular size is normal. No increase in right ventricular wall thickness.  5. Left atrial size was normal.  6. Right atrial size was normal.  7. The mitral valve is grossly normal. Mild mitral valve regurgitation.  8. The tricuspid valve is grossly normal.  9. The aortic valve is tricuspid. Aortic valve regurgitation is not visualized. 10. The pulmonic valve was grossly normal. Pulmonic valve regurgitation is not visualized. 11. The inferior vena cava is normal in size with <50% respiratory variability, suggesting right atrial pressure of 8 mmHg   History of Present Illness     Jessica Ware is a 84 y.o. female with hx of HTN, HLD, GERD who presented with dyspnea.  Presented with 2 weeks hx of mild SOB however it became more intense and began waking her up at night.  She also feels weak and has a mild dry cough. Came to ER for further evaluation. In the ED vitals notable for initial blood pressure of 208/76, heart rate 100, 96% on room air.  Labs notable for BNP 620 with a normal troponin.  EKG notable for sinus rhythm with frequent PVCs in a pattern of bigeminy.  Chest x-ray with mild pulmonary  edema.  Hospital Course     Consultants: None  1. Acute diastolic CHF She was given IV lasix 20mg  x1 with improved symptoms. Echo showed low normal LVEF of 50-55% and grade 1 DD. Held home HCTZ and started on low dose lasix. Evaluate volume status and diuretics at follow up in 2 days with BMET.   2. Hypertensive urgency - BP of 208/76 on presentation. Added BB for PVCs. Continue home Norvasc and lisinopril. Held HCTZ as above. BP stable now. Adjust medication as outpatient.   3. Frequent PVCs - Patient in bigeminy on presentation.  No history of PVCs.  Unclear if PVCs are result or cause of heart failure. Added beta-blocker with improved palpitation and PVCs. She is anxious. Troponin negative. May consider outpatient ischemic eval or PVC burden.   Did the patient have an acute coronary syndrome (MI, NSTEMI, STEMI, etc) this admission?:  No                               Did the patient have a percutaneous coronary intervention (stent / angioplasty)?:  No.    Discharge Vitals Blood pressure 140/78, pulse 78, temperature 98.1 F (36.7 C), temperature source Oral, resp. rate 18, height 5\' 3"  (1.6 m), weight 67.7 kg, SpO2 97 %.  Filed Weights   07/17/19 2030 07/18/19 0320  Weight: 68.5 kg 67.7 kg    Labs & Radiologic Studies    CBC Recent Labs    07/17/19 2045  WBC 8.0  NEUTROABS 5.5  HGB 11.8*  HCT 37.1  MCV 93.5  PLT 262   Basic Metabolic Panel Recent Labs    82/42/35 2045 07/18/19 0523  NA 138 141  K 4.0 4.0  CL 106 108  CO2 20* 25  GLUCOSE 115* 108*  BUN 23 18  CREATININE 0.89 0.87  CALCIUM 9.0 8.9   Liver Function Tests Recent Labs    07/17/19 2045  AST 24  ALT 19  ALKPHOS 82  BILITOT 1.3*  PROT 6.5  ALBUMIN 3.7   High Sensitivity Troponin:   Recent Labs  Lab 07/17/19 2045  TROPONINIHS 6    _____________  DG Chest Port 1 View  Result Date: 07/17/2019 CLINICAL DATA:  Cough EXAM: PORTABLE CHEST 1 VIEW COMPARISON:  04/20/2006 FINDINGS: There are  bilateral Kerley B lines. The heart size is borderline enlarged. Aortic calcifications are noted. There are bronchitic changes at the lung bases bilaterally. There is no pneumothorax. No significant pleural effusion. No acute osseous abnormality. There are old healed left-sided rib fractures. There are degenerative changes of both glenohumeral joints, right worse than left. IMPRESSION: 1. Findings suspicious for developing interstitial pulmonary edema. An atypical infectious process can have a similar appearance. 2. Borderline cardiomegaly. 3.  Aortic Atherosclerosis (ICD10-I70.0). Electronically Signed   By: Jessica Ware M.D.   On: 07/17/2019 21:30   ECHOCARDIOGRAM COMPLETE  Result Date: 07/18/2019   ECHOCARDIOGRAM REPORT   Patient Name:   Jessica Ware Jessica Ware Date of Exam: 07/18/2019 Medical Rec #:  [id number]              Height:       63.0 in Accession #:    [id number]             Weight:       149.2 lb Date of Birth:  1935/10/10              BSA:          1.71 m Patient Age:    83 years               BP:           140/78 mmHg Patient Gender: F                      HR:           78 bpm. Exam Location:  Inpatient Procedure: 2D Echo Indications:    425.9 cardiomyopathy  History:        Patient has no prior history of Echocardiogram examinations.                 Risk Factors:Hypertension and Dyslipidemia.  Sonographer:    Jessica Ware RDCS (AE) Referring Phys: [phone] Jessica Ware IMPRESSIONS  1. Left ventricular ejection fraction, by visual estimation, is 50 to 55%. The left ventricle has low normal function. There is no left ventricular hypertrophy.  2. Left ventricular diastolic parameters are consistent with Grade I diastolic dysfunction (impaired relaxation).  3. The left ventricle has no regional wall motion abnormalities.  4. Global right ventricle has normal systolic function.The right ventricular size is normal. No increase in right ventricular wall thickness.  5. Left atrial size was normal.   6. Right atrial size was normal.  7. The mitral valve is grossly normal. Mild mitral valve regurgitation.  8. The tricuspid valve is grossly normal.  9. The aortic valve is tricuspid. Aortic valve regurgitation is not visualized. 10. The pulmonic  valve was grossly normal. Pulmonic valve regurgitation is not visualized. 11. The inferior vena cava is normal in size with <50% respiratory variability, suggesting right atrial pressure of 8 mmHg. FINDINGS  Left Ventricle: Left ventricular ejection fraction, by visual estimation, is 50 to 55%. The left ventricle has low normal function. The left ventricle has no regional wall motion abnormalities. There is no left ventricular hypertrophy. Left ventricular diastolic parameters are consistent with Grade I diastolic dysfunction (impaired relaxation). Indeterminate filling pressures. Right Ventricle: The right ventricular size is normal. No increase in right ventricular wall thickness. Global RV systolic function is has normal systolic function. Left Atrium: Left atrial size was normal in size. Right Atrium: Right atrial size was normal in size Pericardium: There is no evidence of pericardial effusion. Mitral Valve: The mitral valve is grossly normal. Mild mitral valve regurgitation. Tricuspid Valve: The tricuspid valve is grossly normal. Tricuspid valve regurgitation is trivial. Aortic Valve: The aortic valve is tricuspid. Aortic valve regurgitation is not visualized. Pulmonic Valve: The pulmonic valve was grossly normal. Pulmonic valve regurgitation is not visualized. Pulmonic regurgitation is not visualized. Aorta: The aortic root and ascending aorta are structurally normal, with no evidence of dilitation. Venous: The inferior vena cava is normal in size with less than 50% respiratory variability, suggesting right atrial pressure of 8 mmHg. IAS/Shunts: No atrial level shunt detected by color flow Doppler.  LEFT VENTRICLE PLAX 2D LVIDd:         3.99 cm  Diastology LVIDs:          2.87 cm  LV e' lateral:   11.50 cm/s LV PW:         0.91 cm  LV E/e' lateral: 8.0 LV IVS:        0.79 cm LVOT diam:     2.00 cm LV SV:         38 ml LV SV Index:   21.82 LVOT Area:     3.14 cm  LEFT ATRIUM             Index       RIGHT ATRIUM           Index LA diam:        3.30 cm 1.93 cm/m  RA Area:     13.50 cm LA Vol (A2C):   27.9 ml 16.34 ml/m RA Volume:   29.60 ml  17.34 ml/m LA Vol (A4C):   40.9 ml 23.96 ml/m LA Biplane Vol: 35.0 ml 20.50 ml/m  AORTIC VALVE LVOT Vmax:   43.70 cm/s LVOT Vmean:  34.100 cm/s LVOT VTI:    0.109 m  AORTA Ao Root diam: 2.80 cm MITRAL VALVE MV Area (PHT): 4.68 cm             SHUNTS MV PHT:        46.98 msec           Systemic VTI:  0.11 m MV Decel Time: 162 msec             Systemic Diam: 2.00 cm MV E velocity: 92.10 cm/s 103 cm/s MV A velocity: 85.70 cm/s 70.3 cm/s MV E/A ratio:  1.07       1.5  Zoila Shutter MD Electronically signed by Zoila Shutter MD Signature Date/Time: 07/18/2019/10:50:06 AM    Final    Disposition   Pt is being discharged home today in good condition.  Follow-up Plans & Appointments    Follow-up Information    Beatrice Lecher, PA-C. Go  on 07/20/2019.   Specialties: Cardiology, Physician Assistant Why: @12 :15 for follow up Contact information: [address] [place] [address] [phone]          Discharge Instructions    Diet - low sodium heart healthy   Complete by: As directed    Increase activity slowly   Complete by: As directed       Discharge Medications   Allergies as of 07/18/2019      Reactions   Prednisone Shortness Of Breath   Penicillins Rash   Did it involve swelling of the face/tongue/throat, SOB, or low BP? No Did it involve sudden or severe rash/hives, skin peeling, or any reaction on the inside of your mouth or nose? No Did you need to seek medical attention at a hospital or doctor's office? Yes When did it last happen?2016 If all above answers are "NO", may proceed  with cephalosporin use.   Sulfa Antibiotics Nausea Only   Sulfonamide Derivatives Nausea Only   Sulfamethoxazole Other (See Comments)   unknown   Tramadol Nausea And Vomiting      Medication List    STOP taking these medications   lisinopril-hydrochlorothiazide 20-12.5 MG tablet Commonly known as: ZESTORETIC     TAKE these medications   acetaminophen 500 MG tablet Commonly known as: TYLENOL Take 1,000 mg by mouth every 8 (eight) hours as needed (pain).   amLODipine 2.5 MG tablet Commonly known as: NORVASC Take 2.5 mg by mouth daily.   aspirin 81 MG tablet Take 81 mg by mouth daily.   Cod Liver Oil Caps Take 1 capsule by mouth daily. Once per day   Coenzyme Q-10 100 MG capsule Take 100 mg by mouth daily.   furosemide 20 MG tablet Commonly known as: LASIX Take 1 tablet (20 mg total) by mouth daily.   ibuprofen 200 MG tablet Commonly known as: ADVIL Take 200 mg by mouth every 8 (eight) hours as needed (pain).   lisinopril 20 MG tablet Commonly known as: ZESTRIL Take 1 tablet (20 mg total) by mouth daily. Start taking on: July 19, 2019   metoprolol tartrate 25 MG tablet Commonly known as: LOPRESSOR Take 1 tablet (25 mg total) by mouth 2 (two) times daily.   simvastatin 40 MG tablet Commonly known as: ZOCOR Take 40 mg by mouth at bedtime.   Vitamin D3 50 MCG (2000 UT) capsule Take 2,000 Units by mouth daily.        Outstanding Labs/Studies   BMET to follow up  Duration of Discharge Encounter   Greater than 30 minutes including physician time.  Jarrett Soho, PA 07/18/2019, 11:48 AM

## 2019-07-18 NOTE — Care Management CC44 (Signed)
Condition Code 44 Documentation Completed  Patient Details  Name: [name] [id number] MRN: [id number] Date of Birth: [birth year]   Condition Code 44 given:  Yes Patient signature on Condition Code 44 notice:  Yes Documentation of 2 MD's agreement:  Yes Code 44 added to claim:  Yes    Lawerance Sabal, RN 07/18/2019, 2:09 PM

## 2019-07-18 NOTE — Progress Notes (Signed)
  Echocardiogram 2D Echocardiogram has been performed.  Jessica Ware 07/18/2019, 9:56 AM

## 2019-07-19 NOTE — Progress Notes (Addendum)
Cardiology Office Note  Date: 07/20/2019   ID: Jessica Ware SEGBTDV, DOB 27-Sep-1935, MRN [id number]  PCP:  Guadlupe Spanish, MD  Cardiologist:  Sherren Mocha, MD Electrophysiologist:  None   No chief complaint on file.   History of Present Illness: Jessica Ware is a 84 y.o. female with a recent hospital admission on July 17, 2019 with complaints of 2 weeks of mild shortness of breath becoming progressively worse over the prior 2 days from presentation.  She was hypertensive on arrival with tachycardia.  She had an elevated BNP of 620 with normal troponin.  EKG showed sinus tachycardia with frequent PVCs with a pattern of bigeminy.  Chest x-ray showed mild pulmonary edema.  Patient was discharged on Lasix 20 mg daily and metoprolol tartrate 25 mg p.o. twice daily.  Discharge note suggests possible need for outpatient work-up for ischemia in the form of a stress test given EKG with significant PVCs in a bigeminal pattern.   Echocardiogram performed for new onset heart failure. Ejection fraction 50 to 55%.  Grade 1 diastolic dysfunction.  Mild mitral regurgitation.  Past medical history includes hypertension, hyperlipidemia, obesity, GERD, depression, anxiety, renal calculi, esophageal stricture with history of esophageal dilation.  Past Medical History:  Diagnosis Date  . Allergic rhinitis   . Anxiety   . Depression    pt denies  . GERD (gastroesophageal reflux disease)   . History of renal stone   . Hyperlipidemia   . Hypertension   . Obesity   . Status post dilation of esophageal narrowing     Past Surgical History:  Procedure Laterality Date  . ABDOMINAL HYSTERECTOMY    . KIDNEY STONE SURGERY     1985    Current Outpatient Medications  Medication Sig Dispense Refill  . acetaminophen (TYLENOL) 500 MG tablet Take 1,000 mg by mouth every 8 (eight) hours as needed (pain).     Marland Kitchen amLODipine (NORVASC) 2.5 MG tablet Take 1 tablet (2.5 mg total) by mouth 2  (two) times daily. 180 tablet 3  . aspirin 81 MG tablet Take 81 mg by mouth daily.     . Cholecalciferol (VITAMIN D3) 50 MCG (2000 UT) capsule Take 2,000 Units by mouth daily.    Marland Kitchen Cod Liver Oil CAPS Take 1 capsule by mouth daily. Once per day     . Coenzyme Q-10 100 MG capsule Take 100 mg by mouth daily.    . furosemide (LASIX) 20 MG tablet Take 1 tablet (20 mg total) by mouth daily. 30 tablet 2  . ibuprofen (ADVIL,MOTRIN) 200 MG tablet Take 200 mg by mouth every 8 (eight) hours as needed (pain).     Marland Kitchen lisinopril (ZESTRIL) 20 MG tablet Take 1 tablet (20 mg total) by mouth daily. 30 tablet 3  . metoprolol tartrate (LOPRESSOR) 25 MG tablet Take 1 tablet (25 mg total) by mouth 2 (two) times daily. 60 tablet 3  . simvastatin (ZOCOR) 40 MG tablet Take 40 mg by mouth at bedtime.     No current facility-administered medications for this visit.   Allergies:  Prednisone, Penicillins, Sulfa antibiotics, Sulfonamide derivatives, Sulfamethoxazole, and Tramadol   Social History: The patient  reports that she has never smoked. She has never used smokeless tobacco. She reports that she does not drink alcohol or use drugs.   Family History: The patient's family history includes Heart disease in her father; Heart disease (age of onset: 75) in her mother; Hypertension in her mother; Kidney disease in her father;  Prostate cancer in her father.   ROS:  Please see the history of present illness. Otherwise, complete review of systems is positive for none.  All other systems are reviewed and negative.   Physical Exam: VS:  BP 132/70   Pulse 71   Ht 5\' 3"  (1.6 m)   Wt 150 lb (68 kg)   SpO2 96%   BMI 26.57 kg/m , BMI Body mass index is 26.57 kg/m.  Wt Readings from Last 3 Encounters:  07/20/19 150 lb (68 kg)  07/18/19 149 lb 3.2 oz (67.7 kg)  10/26/18 149 lb (67.6 kg)    General: Patient appears comfortable at rest. Neck: Supple, no elevated JVP or carotid bruits, no thyromegaly. Lungs: Clear to  auscultation, nonlabored breathing at rest. Cardiac: Regular rate and rhythm intermittent skipped beats, no S3 or significant systolic murmur, no pericardial rub. Extremities: No pitting edema, venous varicosities, distal pulses 2+. Skin: Warm and dry. Neuropsychiatric: Alert and oriented x3, affect grossly appropriate.  ECG:  An ECG dated 07/17/2019 was personally reviewed today and demonstrated:  Sinus tachycardia rate of 118, ventricular bigeminy, nonspecific repolarization abnormality, diffuse leads.  Recent Labwork: 11/04/2018: TSH 2.280 07/17/2019: ALT 19; AST 24; B Natriuretic Peptide 621.8; Hemoglobin 11.8; Platelets 262 07/18/2019: BUN 18; Creatinine, Ser 0.87; Potassium 4.0; Sodium 141     Component Value Date/Time   CHOL 219 (H) 11/04/2018 1359   TRIG 73 11/04/2018 1359   HDL 64 11/04/2018 1359   CHOLHDL 3.4 11/04/2018 1359   CHOLHDL 3.8 02/26/2016 1332   VLDL 19 02/26/2016 1332   LDLCALC 140 (H) 11/04/2018 1359   LDLDIRECT 112.1 07/03/2011 1046    Other Studies Reviewed Today:  Echocardiogram performed July 18, 2019 1. Left ventricular ejection fraction, by visual estimation, is 50 to 55%. The left ventricle has low normal function. There is no left ventricular hypertrophy. 2. Left ventricular diastolic parameters are consistent with Grade I diastolic dysfunction (impaired relaxation). 3. The left ventricle has no regional wall motion abnormalities. 4. Global right ventricle has normal systolic function.The right ventricular size is normal. No increase in right ventricular wall thickness. 5. Left atrial size was normal. 6. Right atrial size was normal. 7. The mitral valve is grossly normal. Mild mitral valve regurgitation. 8. The tricuspid valve is grossly normal. 9. The aortic valve is tricuspid. Aortic valve regurgitation is not visualized. 10. The pulmonic valve was grossly normal. Pulmonic valve regurgitation is not visualized. 11. The inferior vena cava is  normal in size with <50% respiratory variability, suggesting right atrial pressure of 8 mmHg.  Assessment and Plan:  1. Acute diastolic heart failure (HCC)   2. Essential hypertension   3. Mixed hyperlipidemia   4. Frequent PVCs    1. Acute diastolic heart failure [facility]) Recent hospital admission for acute diastolic heart failure.  Patient presented with chest pain and dyspnea on arrival.  Was noted to be in a sinus tachycardia with bigeminal pattern.  She was admitted and diuresed.  She was discharged on Lasix and metoprolol.  She currently denies any rapid heartbeats / palpitations or arrhythmias.  Continue metoprolol 25 mg p.o. twice daily, Lasix 20 mg daily.  Get BMP today  2. Essential hypertension Blood pressure today 132/70.  Continue amlodipine 2.5 mg p.o. twice daily.  Continue lisinopril 20 mg daily.  3. Mixed hyperlipidemia Patient is taking simvastatin statin 40 mg daily.  Most recent LDL in May was 140.  Recheck fasting lipid panel and LFTs today.  4. Frequent PVCs On  exam today heart rhythm is primarily regular with skipped beats approximately every 2-3 normal beats.  Patient's EKG and hospital on recent admission with sinus tachycardia with bigeminal pattern with evidence of possible ischemia.  Heart rate today 71 with evidence of skipped beats on auscultation.  Pattern appears to be premature beat every 2nd-3rd beat.  Get a Lexiscan Myoview stress test secondary to recent admission for chest pain, sinus tachycardia with bigeminal pattern with evidence of possible ischemia.  Get Zio patch for 3 days to measure PVC burden.  Medication Adjustments/Labs and Tests Ordered: Current medicines are reviewed at length with the patient today.  Concerns regarding medicines are outlined above.    There are no Patient Instructions on file for this visit.       Signed, Rennis Harding, NP 07/20/2019 1:05 PM    [facility] [address] [place], [place], [place]  [address] Phone: [phone]; Fax: [phone]

## 2019-07-20 ENCOUNTER — Ambulatory Visit: Payer: Medicare Other | Admitting: Family Medicine

## 2019-07-20 ENCOUNTER — Telehealth: Payer: Self-pay | Admitting: *Deleted

## 2019-07-20 ENCOUNTER — Other Ambulatory Visit: Payer: Self-pay

## 2019-07-20 ENCOUNTER — Encounter: Payer: Self-pay | Admitting: Physician Assistant

## 2019-07-20 VITALS — BP 132/70 | HR 71 | Ht 63.0 in | Wt 150.0 lb

## 2019-07-20 DIAGNOSIS — I10 Essential (primary) hypertension: Secondary | ICD-10-CM

## 2019-07-20 DIAGNOSIS — I493 Ventricular premature depolarization: Secondary | ICD-10-CM | POA: Diagnosis not present

## 2019-07-20 DIAGNOSIS — I5031 Acute diastolic (congestive) heart failure: Secondary | ICD-10-CM | POA: Diagnosis not present

## 2019-07-20 DIAGNOSIS — E782 Mixed hyperlipidemia: Secondary | ICD-10-CM | POA: Diagnosis not present

## 2019-07-20 NOTE — Patient Instructions (Addendum)
Medication Instructions:   Your physician recommends that you continue on your current medications as directed. Please refer to the Current Medication list given to you today.  *If you need a refill on your cardiac medications before your next appointment, please call your pharmacy*  Lab Work:  You will have labs drawn today: Lipid panel and BMET  If you have labs (blood work) drawn today and your tests are completely normal, you will receive your results only by: Marland Kitchen MyChart Message (if you have MyChart) OR . A paper copy in the mail If you have any lab test that is abnormal or we need to change your treatment, we will call you to review the results.  Testing/Procedures:  Your physician has requested that you have a lexiscan myoview. For further information please visit [email or web address] Please follow instruction sheet, as given.  A zio monitor was ordered today. It will remain on for 3 days. You will then return monitor and event diary in provided box. It takes 1-2 weeks for report to be downloaded and returned to [place]. We will call you with the results. If monitor falls off or has orange flashing light, please call Zio for further instructions.    Follow-Up: At [facility], you and your health needs are our priority.  As part of our continuing mission to provide you with exceptional heart care, we have created designated Provider Care Teams.  These Care Teams include your primary Cardiologist (physician) and Advanced Practice Providers (APPs -  Physician Assistants and Nurse Practitioners) who all work together to provide you with the care you need, when you need it.  Your next appointment:    2-3 months with Tereso Newcomer, PA-C

## 2019-07-20 NOTE — Telephone Encounter (Signed)
Patient enrolled for [employer] to mail a 3 day ZIO XT long term holter monitor to the patients home. 

## 2019-07-21 LAB — BASIC METABOLIC PANEL
BUN/Creatinine Ratio: 30 — ABNORMAL HIGH (ref 12–28)
BUN: 30 mg/dL — ABNORMAL HIGH (ref 8–27)
CO2: 25 mmol/L (ref 20–29)
Calcium: 9.8 mg/dL (ref 8.7–10.3)
Chloride: 101 mmol/L (ref 96–106)
Creatinine, Ser: 1 mg/dL (ref 0.57–1.00)
GFR calc Af Amer: 61 mL/min/{1.73_m2} (ref >59)
GFR calc non Af Amer: 53 mL/min/{1.73_m2} — ABNORMAL LOW (ref >59)
Glucose: 86 mg/dL (ref 65–99)
Potassium: 4.7 mmol/L (ref 3.5–5.2)
Sodium: 140 mmol/L (ref 134–144)

## 2019-07-21 LAB — LIPID PANEL
Chol/HDL Ratio: 2.7 ratio (ref 0.0–4.4)
Cholesterol, Total: 192 mg/dL (ref 100–199)
HDL: 72 mg/dL (ref >39)
LDL Chol Calc (NIH): 104 mg/dL — ABNORMAL HIGH (ref 0–99)
Triglycerides: 87 mg/dL (ref 0–149)
VLDL Cholesterol Cal: 16 mg/dL (ref 5–40)

## 2019-07-23 ENCOUNTER — Other Ambulatory Visit (INDEPENDENT_AMBULATORY_CARE_PROVIDER_SITE_OTHER): Payer: Medicare Other

## 2019-07-23 DIAGNOSIS — I493 Ventricular premature depolarization: Secondary | ICD-10-CM

## 2019-07-26 ENCOUNTER — Telehealth (HOSPITAL_COMMUNITY): Payer: Self-pay

## 2019-07-26 NOTE — Telephone Encounter (Signed)
Spoke with the patient, instructions given. She stated that she would be hear for her test unless the weather was bad. Asked to call back with any questions. S.Taniya Dasher EMTP

## 2019-07-27 ENCOUNTER — Telehealth: Payer: Self-pay

## 2019-07-27 NOTE — Telephone Encounter (Signed)
I called and spoke with patient, she is aware per Rennis Harding that her fluid buildup was from the fast heart rate as discussed at office visit. Patient thanked me for the call and verbalized understanding.

## 2019-07-27 NOTE — Telephone Encounter (Signed)
-----   Message from Netta Neat., NP sent at 07/26/2019 10:49 AM EST ----- Regarding: RE: fluid/lab results I think we discussed this and told her that the fluid buildup was due to her fast heart rate.  Unless she goes into another fast rhythm she may not have any problems with fluid buildup.  We are just backing off on the Lasix a little for now.  I will look at her echo and send you another message about the leaking valve.  I do not have her information in front of me at the moment.  I will send you a message you can call her with in a little bit.  Thanks ----- Message ----- From: Lajoyce Corners, CMA Sent: 07/26/2019  10:31 AM EST To: Netta Neat., NP Subject: fluid/lab results                              Patient is aware to take Furosemide 20 mg every other day. She would like to know how long to take this? Also, she would like to know if this is something she will have to deal with from now on? Did the leaky valve cause the fluid build up? She is just confused as to where the fluid is coming from?

## 2019-07-28 ENCOUNTER — Ambulatory Visit (HOSPITAL_COMMUNITY): Payer: Medicare Other | Attending: Cardiovascular Disease

## 2019-07-28 ENCOUNTER — Other Ambulatory Visit: Payer: Self-pay

## 2019-07-28 DIAGNOSIS — I5031 Acute diastolic (congestive) heart failure: Secondary | ICD-10-CM | POA: Diagnosis not present

## 2019-07-28 LAB — MYOCARDIAL PERFUSION IMAGING
Peak HR: 100 {beats}/min
Rest HR: 62 {beats}/min
SDS: 8
SRS: 0
SSS: 8
TID: 0.91

## 2019-07-28 MED ORDER — TECHNETIUM TC 99M TETROFOSMIN IV KIT
32.1000 | PACK | Freq: Once | INTRAVENOUS | Status: AC | PRN
  Administered 2019-07-28: 32.1 via INTRAVENOUS
  Filled 2019-07-28: qty 33

## 2019-07-28 MED ORDER — TECHNETIUM TC 99M TETROFOSMIN IV KIT
9.6000 | PACK | Freq: Once | INTRAVENOUS | Status: AC | PRN
  Administered 2019-07-28: 9.6 via INTRAVENOUS
  Filled 2019-07-28: qty 10

## 2019-07-28 MED ORDER — REGADENOSON 0.4 MG/5ML IV SOLN
0.4000 mg | Freq: Once | INTRAVENOUS | Status: AC
  Administered 2019-07-28: 13:00:00 0.4 mg via INTRAVENOUS

## 2019-07-29 ENCOUNTER — Telehealth: Payer: Self-pay

## 2019-07-29 NOTE — Telephone Encounter (Signed)
I called and spoke with patient, she is aware of Greig Castilla Quinn's recommendations.

## 2019-07-29 NOTE — Telephone Encounter (Signed)
-----   Message from Netta Neat., NP sent at 07/27/2019 10:21 PM EST ----- Regarding: RE: fluid/lab results Jessica Ware.  In regards to your question about where is the fluid coming from?  Tell the patient her left ventricle is stiff and has problems relaxing to fill with blood.  She has a little bit of a leaking mitral valve.  Her heart rate was fast which caused the fluid to back up into her pulmonary system and cause problems with breathing.  Her blood pressure was also high.  Tell her the combination of these 3 things caused the fluid to back up into her system.  Tell her as long as we can manage her high blood pressure and her heart rate this likely will not happen again.  However the stress test that we are scheduling her for is to determine whether she has blockages in her heart which may contribute to this as well.  She may or may not understand this but tell her to take all of her medication as directed.  Tell her not to miss any doses.  Tell her once we get the results of her stress test we can let her know if there is anything else that contributed to this event.  Thank you ----- Message ----- From: Lajoyce Corners, CMA Sent: 07/26/2019  10:31 AM EST To: Netta Neat., NP Subject: fluid/lab results                              Patient is aware to take Furosemide 20 mg every other day. She would like to know how long to take this? Also, she would like to know if this is something she will have to deal with from now on? Did the leaky valve cause the fluid build up? She is just confused as to where the fluid is coming from?

## 2019-07-29 NOTE — Telephone Encounter (Signed)
I called and spoke with patient about Jessica Ware's recommendations earlier this week in response to questions patient had. Patient had a stress test and has a lot of questions about the test and her Lasix. She would like an office visit with Dr. Excell Seltzer and I advised her I did not see any openings. I tried to offer a visit with Tereso Newcomer. She did not want that, she would like Dr. Excell Seltzer to call her about her results and to discuss Lasix.

## 2019-08-01 ENCOUNTER — Telehealth: Payer: Self-pay | Admitting: Cardiovascular Disease

## 2019-08-01 MED ORDER — FUROSEMIDE 20 MG PO TABS: 20.0000 mg | ORAL_TABLET | ORAL | 3 refills | Status: DC

## 2019-08-01 NOTE — Telephone Encounter (Signed)
Patient is requesting for Dr. Excell Seltzer or Orpha Bur to go over her stress test results and the next plan of action for the problems she have.

## 2019-08-01 NOTE — Telephone Encounter (Signed)
Reviewed results with patient in great detail. She has no symptoms (she specifically denies CP and SOB). She will continue lasix 20 mg qod as directed by Nena Polio, NP (see 1/27 phone note) and will call if symptoms progress. Per patient request, scheduled her and her husband for OVs with Dr. Excell Seltzer in May. She was grateful for call and agrees with treatment plan.

## 2019-08-01 NOTE — Telephone Encounter (Signed)
Stress test results reviewed.  This is a low risk study with a small amount of myocardium involved.  I feel comfortable with medical management unless she is having severe angina or any other red flag symptoms.

## 2019-10-03 ENCOUNTER — Other Ambulatory Visit: Payer: Self-pay | Admitting: Cardiovascular Disease

## 2019-10-06 ENCOUNTER — Other Ambulatory Visit: Payer: Self-pay | Admitting: Physician Assistant

## 2019-10-10 ENCOUNTER — Other Ambulatory Visit: Payer: Self-pay | Admitting: Cardiovascular Disease

## 2019-10-12 NOTE — Telephone Encounter (Signed)
Outpatient Medication Detail   Disp Refills Start End   lisinopril (ZESTRIL) 20 MG tablet 30 tablet 2 10/06/2019    Sig - Route: Take 1 tablet (20 mg total) by mouth daily. - Oral   Sent to pharmacy as: lisinopril (ZESTRIL) 20 MG tablet   E-Prescribing Status: Receipt confirmed by pharmacy (10/06/2019  1:51 PM EDT)   Renewals  Renewal provider: Manson Passey, PA     Pharmacy  [facility] - [place],  - [address]

## 2019-10-27 ENCOUNTER — Telehealth: Payer: Self-pay | Admitting: Cardiovascular Disease

## 2019-10-27 NOTE — Telephone Encounter (Signed)
New Message    Pt is calling and says she would like to have her labs done on 11/18/19, the same day her husband comes in.  The lab order that is in expires before that date.  Pt also would like for the nurse to call her. She says at her recent hospital visit they changed her medications. She says she doesn't feel herself and feels lethargic    Please call

## 2019-10-27 NOTE — Telephone Encounter (Signed)
Ms. Vankirk reports that since her hospitalization, she has been lethargic, tired and "not really herself."  She denies SOB and CP and generally feels well aside from constant lethargy.  Her BP has been running 140s/80s and HR 90s.  At that hospitalization, she started metoprolol 25 mg BID.  She has an appointment with Dr. Excell Seltzer late May. She would like to make a medication change prior to that time to see if she feels better. She understands she will be called with Dr. Earmon Phoenix recommendations.

## 2019-10-28 MED ORDER — METOPROLOL SUCCINATE ER 25 MG PO TB24: 25.0000 mg | ORAL_TABLET | Freq: Every day | ORAL | 3 refills | Status: DC

## 2019-10-28 MED ORDER — METOPROLOL SUCCINATE ER 25 MG PO TB24
25.0000 mg | ORAL_TABLET | Freq: Every day | ORAL | 3 refills | Status: DC
Start: 2019-10-28 — End: 2019-10-28

## 2019-10-28 NOTE — Telephone Encounter (Signed)
Let's try Toprol XL 25 mg QHS. thanks

## 2019-10-28 NOTE — Telephone Encounter (Signed)
Instructed patient to STOP METOPROLOL and START TOPROL 25 mg qhs. She will monitor symptoms and keep BP/HR log and bring to visit with Dr. Excell Seltzer next month. She will call if symptoms do not improve prior to that time. She was grateful for assistance.

## 2019-11-21 ENCOUNTER — Other Ambulatory Visit: Payer: Self-pay

## 2019-11-21 ENCOUNTER — Ambulatory Visit: Payer: Medicare Other | Admitting: Cardiovascular Disease

## 2019-11-21 ENCOUNTER — Encounter: Payer: Self-pay | Admitting: Cardiovascular Disease

## 2019-11-21 VITALS — BP 138/64 | HR 46 | Ht 63.0 in | Wt 153.4 lb

## 2019-11-21 DIAGNOSIS — E782 Mixed hyperlipidemia: Secondary | ICD-10-CM

## 2019-11-21 DIAGNOSIS — R001 Bradycardia, unspecified: Secondary | ICD-10-CM | POA: Diagnosis not present

## 2019-11-21 DIAGNOSIS — I10 Essential (primary) hypertension: Secondary | ICD-10-CM

## 2019-11-21 DIAGNOSIS — I5032 Chronic diastolic (congestive) heart failure: Secondary | ICD-10-CM | POA: Diagnosis not present

## 2019-11-21 MED ORDER — AMLODIPINE BESYLATE 2.5 MG PO TABS: ORAL_TABLET | ORAL | 3 refills | Status: DC

## 2019-11-21 NOTE — Patient Instructions (Addendum)
Medication Instructions:  1) STOP METOPROLOL 2) INCREASE AMLODIPINE to 5 mg in the morning and 2.5 mg in the afternoon *If you need a refill on your cardiac medications before your next appointment, please call your pharmacy*  Follow-Up: You have been referred to the HTN CLINIC in 3 months. Please bring a blood pressure log with you to that visit.  At [facility], you and your health needs are our priority.  As part of our continuing mission to provide you with exceptional heart care, we have created designated Provider Care Teams.  These Care Teams include your primary Cardiologist (physician) and Advanced Practice Providers (APPs -  Physician Assistants and Nurse Practitioners) who all work together to provide you with the care you need, when you need it. Your next appointment:   12 month(s) The format for your next appointment:   In Person Provider:   You may see Tonny Bollman, MD or one of the following Advanced Practice Providers on your designated Care Team:    Tereso Newcomer, PA-C  [place], [place]

## 2019-11-21 NOTE — Progress Notes (Signed)
Cardiology Office Note:    Date:  11/21/2019   ID:  Jessica Ware Mutual, DOB 27-Dec-1935, MRN [id number]  PCP:  Karle Plumber, MD  Cardiologist:  Tonny Bollman, MD  Electrophysiologist:  None   Referring MD: Karle Plumber, MD   Chief Complaint  Patient presents with  . Fatigue    History of Present Illness:    Jessica Ware is a 84 y.o. female with a hx of hypertension and diastolic heart failure, presenting for follow-up evaluation.  The patient was hospitalized in January 2021 with acute diastolic heart failure.  BNP was elevated and chest x-ray showed mild pulmonary edema.  She was diuresed with IV Lasix and clinically improved very quickly.  She was discharged on Lasix 20 mg and this has been reduced to every other day dosing.  She feels much better and has not had any recent problems with shortness of breath.  She does complain of generalized fatigue and has been concerned about slow heart rates.  On her home readings, her heart rates are frequently in the 40s.  The patient denies chest pain or pressure.  She has had mild ankle edema.  No orthopnea, PND, or heart palpitations.  Primary complaint is fatigue.  Past Medical History:  Diagnosis Date  . Allergic rhinitis   . Anxiety   . Depression    pt denies  . GERD (gastroesophageal reflux disease)   . History of renal stone   . Hyperlipidemia   . Hypertension   . Obesity   . Status post dilation of esophageal narrowing     Past Surgical History:  Procedure Laterality Date  . ABDOMINAL HYSTERECTOMY    . KIDNEY STONE SURGERY     1985    Current Medications: Current Meds  Medication Sig  . acetaminophen (TYLENOL) 500 MG tablet Take 1,000 mg by mouth every 8 (eight) hours as needed (pain).   Marland Kitchen amLODipine (NORVASC) 2.5 MG tablet Take 5 mg (2 tablets) in the AM. Take 2.5 mg (one tablet) in the evening.  Marland Kitchen aspirin 81 MG tablet Take 81 mg by mouth daily.   . Cholecalciferol (VITAMIN D3) 50 MCG (2000  UT) capsule Take 2,000 Units by mouth daily.  Marland Kitchen Cod Liver Oil CAPS Take 1 capsule by mouth daily. Once per day   . Coenzyme Q-10 100 MG capsule Take 100 mg by mouth daily.  . furosemide (LASIX) 20 MG tablet Take 1 tablet (20 mg total) by mouth every other day.  . ibuprofen (ADVIL,MOTRIN) 200 MG tablet Take 200 mg by mouth every 8 (eight) hours as needed (pain).   Marland Kitchen lisinopril (ZESTRIL) 20 MG tablet Take 1 tablet (20 mg total) by mouth daily.  Marland Kitchen MAGNESIUM PO Take by mouth.  . simvastatin (ZOCOR) 40 MG tablet Take 40 mg by mouth at bedtime.  . [DISCONTINUED] amLODipine (NORVASC) 2.5 MG tablet Take 1 tablet (2.5 mg total) by mouth 2 (two) times daily.  . [DISCONTINUED] metoprolol succinate (TOPROL XL) 25 MG 24 hr tablet Take 1 tablet (25 mg total) by mouth at bedtime.     Allergies:   Prednisone, Penicillins, Sulfa antibiotics, Sulfonamide derivatives, Sulfamethoxazole, and Tramadol   Social History   Socioeconomic History  . Marital status: Married    Spouse name: Not on file  . Number of children: 3  . Years of education: Not on file  . Highest education level: Not on file  Occupational History  . Occupation: retired    Associate Professor: HOMEMAKER  Tobacco Use  .  Smoking status: Never Smoker  . Smokeless tobacco: Never Used  Substance and Sexual Activity  . Alcohol use: No  . Drug use: No  . Sexual activity: Not on file  Other Topics Concern  . 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         Social Determinants of Health   Financial Resource Strain:   . Difficulty of Paying Living Expenses:   Food Insecurity:   . Worried About Charity fundraiser in the Last Year:   . Arboriculturist in the Last Year:   Transportation Needs:   . Film/video editor (Medical):   Marland Kitchen Lack of Transportation  (Non-Medical):   Physical Activity:   . Days of Exercise per Week:   . Minutes of Exercise per Session:   Stress:   . Feeling of Stress :   Social Connections:   . Frequency of Communication with Friends and Family:   . Frequency of Social Gatherings with Friends and Family:   . Attends Religious Services:   . Active Member of Clubs or Organizations:   . Attends Archivist Meetings:   Marland Kitchen Marital Status:      Family History: The patient's family history includes Heart disease in her father; Heart disease (age of onset: 46) in her mother; Hypertension in her mother; Kidney disease in her father; Prostate cancer in her father.  ROS:   Please see the history of present illness.    All other systems reviewed and are negative.  EKGs/Labs/Other Studies Reviewed:    The following studies were reviewed today: Myocardial Perfusion Scan 07/28/2019: Study Highlights    There was no ST segment deviation noted during stress.  No T wave inversion was noted during stress.  Defect 1: There is a medium defect of mild severity.   Low risk stress nuclear study with a limited area of mild ischemia, corresponding to the distribution of a diagonal or ramus intermedius artery. Non-gated study.    Echo 07-18-2019: IMPRESSIONS    1. Left ventricular ejection fraction, by visual estimation, is 50 to  55%. The left ventricle has low normal function. There is no left  ventricular hypertrophy.  2. Left ventricular diastolic parameters are consistent with Grade I  diastolic dysfunction (impaired relaxation).  3. The left ventricle has no regional wall motion abnormalities.  4. Global right ventricle has normal systolic function.The right  ventricular size is normal. No increase in right ventricular wall  thickness.  5. Left atrial size was normal.  6. Right atrial size was normal.  7. The mitral valve is grossly normal. Mild mitral valve regurgitation.  8. The tricuspid  valve is grossly normal.  9. The aortic valve is tricuspid. Aortic valve regurgitation is not  visualized.  10. The pulmonic valve was grossly normal. Pulmonic valve regurgitation is  not visualized.  11. The inferior vena cava is normal in size with <50% respiratory  variability, suggesting right atrial pressure of 8 mmHg.   EKG:  EKG is not ordered today.    Recent Labs: 07/17/2019: ALT 19; B Natriuretic Peptide 621.8; Hemoglobin 11.8; Platelets 262 07/20/2019: BUN 30; Creatinine, Ser 1.00; Potassium 4.7; Sodium 140  Recent Lipid Panel    Component Value Date/Time  CHOL 192 07/20/2019 1343   TRIG 87 07/20/2019 1343   HDL 72 07/20/2019 1343   CHOLHDL 2.7 07/20/2019 1343   CHOLHDL 3.8 02/26/2016 1332   VLDL 19 02/26/2016 1332   LDLCALC 104 (H) 07/20/2019 1343   LDLDIRECT 112.1 07/03/2011 1046    Physical Exam:    VS:  BP 138/64   Pulse (!) 46   Ht 5\' 3"  (1.6 m)   Wt 153 lb 6.4 oz (69.6 kg)   SpO2 97%   BMI 27.17 kg/m     Wt Readings from Last 3 Encounters:  11/21/19 153 lb 6.4 oz (69.6 kg)  07/28/19 150 lb (68 kg)  07/20/19 150 lb (68 kg)     GEN:  Well nourished, well developed in no acute distress HEENT: Normal NECK: No JVD; No carotid bruits LYMPHATICS: No lymphadenopathy CARDIAC: RRR, no murmurs, rubs, gallops RESPIRATORY:  Clear to auscultation without rales, wheezing or rhonchi  ABDOMEN: Soft, non-tender, non-distended MUSCULOSKELETAL:  Trace bilateral ankle edema; No deformity  SKIN: Warm and dry NEUROLOGIC:  Alert and oriented x 3 PSYCHIATRIC:  Normal affect   ASSESSMENT:    1. Essential hypertension   2. Chronic diastolic heart failure (HCC)   3. Mixed hyperlipidemia   4. Bradycardia    PLAN:    In order of problems listed above:  1. She brings in home readings.  Blood pressure is above goal with systolic readings greater than 140 on a fairly consistent basis.  I have recommended that she increase her morning amlodipine dose to 5 mg daily.  I  have recommended that she stop metoprolol succinate because of marked bradycardia.  We discussed a sodium restricted diet.  She will follow up with the hypertension clinic in 3 months. 2. Appears euvolemic and dyspnea has essentially resolved.  She will continue on furosemide every other day.  Continue lisinopril. Echo and low-risk nuclear scans are reviewed.  3. Treated with simvastatin 4. Stop metoprolol succinate.  Continue to monitor blood pressure and heart rate periodically.  Bring in readings to hypertension clinic.   Medication Adjustments/Labs and Tests Ordered: Current medicines are reviewed at length with the patient today.  Concerns regarding medicines are outlined above.  Orders Placed This Encounter  Procedures  . AMB REFERRAL TO ADVANCED HTN CLINIC   Meds ordered this encounter  Medications  . amLODipine (NORVASC) 2.5 MG tablet    Sig: Take 5 mg (2 tablets) in the AM. Take 2.5 mg (one tablet) in the evening.    Dispense:  270 tablet    Refill:  3    Patient Instructions  Medication Instructions:  1) STOP METOPROLOL 2) INCREASE AMLODIPINE to 5 mg in the morning and 2.5 mg in the afternoon *If you need a refill on your cardiac medications before your next appointment, please call your pharmacy*  Follow-Up: You have been referred to the HTN CLINIC in 3 months. Please bring a blood pressure log with you to that visit.  At [facility], you and your health needs are our priority.  As part of our continuing mission to provide you with exceptional heart care, we have created designated Provider Care Teams.  These Care Teams include your primary Cardiologist (physician) and Advanced Practice Providers (APPs -  Physician Assistants and Nurse Practitioners) who all work together to provide you with the care you need, when you need it. Your next appointment:   12 month(s) The format for your next appointment:   In Person Provider:   You may  see Tonny Bollman, MD or one of  the following Advanced Practice Providers on your designated Care Team:    Tereso Newcomer, PA-C  [name], [place]    Signed, Tonny Bollman, MD  11/21/2019 3:35 PM    [facility]

## 2020-01-09 ENCOUNTER — Other Ambulatory Visit: Payer: Self-pay | Admitting: Cardiovascular Disease

## 2020-01-10 ENCOUNTER — Other Ambulatory Visit: Payer: Self-pay | Admitting: Cardiovascular Disease

## 2020-01-16 ENCOUNTER — Telehealth: Payer: Self-pay | Admitting: Cardiovascular Disease

## 2020-01-16 NOTE — Telephone Encounter (Signed)
Patient is requesting to discuss list of medications with Dr. Earmon Phoenix nurse. Please call.

## 2020-01-16 NOTE — Telephone Encounter (Signed)
The patient wanted her husband to review her med list.  All meds reviewed and there are no further questions. They were grateful for assistance.

## 2020-02-22 ENCOUNTER — Ambulatory Visit: Payer: Medicare Other

## 2020-02-22 ENCOUNTER — Telehealth: Payer: Self-pay | Admitting: Pharmacist

## 2020-02-22 NOTE — Progress Notes (Deleted)
Patient ID: Jessica Ware DGLOVFI                 DOB: 26-Jan-1936                      MRN: [id number]     HPI: Jessica Ware is a 84 y.o. female referred by Dr. Excell Seltzer to HTN clinic. PMH is significant for HTN, HLD, diastolic HF with LVEF 50-55% on 07/2019 echo, GERD, and anxiety/depression. She was last seen in clinic by Dr Excell Seltzer in May when systolic BP was running consistently > 140. Amlodipine was increased to 5mg  in the AM and 2.5mg  in the PM and her metoprolol was stopped secondary to bradycardia.  Combine amlo dose, any LEE? Using ibuprofen? Increase amlo to 10 or lisinopril to 40 if needed  Current HTN meds: amlodipine 5mg  AM and 2.5mg  PM, furosemide 20mg  every other day, lisinopril 20mg  daily Previously tried: Toprol 25mg  daily - bradycardia BP goal: <130/46mmHg  Family History: The patient's family history includes Heart disease in her father; Heart disease (age of onset: 30) in her mother; Hypertension in her mother; Kidney disease in her father; Prostate cancer in her father.  Social History: Denies tobacco, alcohol, and illicit drug use.  Diet:   Exercise:   Home BP readings:   Wt Readings from Last 3 Encounters:  11/21/19 153 lb 6.4 oz (69.6 kg)  07/28/19 150 lb (68 kg)  07/20/19 150 lb (68 kg)   BP Readings from Last 3 Encounters:  11/21/19 138/64  07/20/19 132/70  07/18/19 (!) 110/51   Pulse Readings from Last 3 Encounters:  11/21/19 (!) 46  07/20/19 71  07/18/19 74    Renal function: CrCl cannot be calculated (Patient's most recent lab result is older than the maximum 21 days allowed.).  Past Medical History:  Diagnosis Date  . Allergic rhinitis   . Anxiety   . Depression    pt denies  . GERD (gastroesophageal reflux disease)   . History of renal stone   . Hyperlipidemia   . Hypertension   . Obesity   . Status post dilation of esophageal narrowing     Current Outpatient Medications on File Prior to Visit  Medication Sig Dispense  Refill  . acetaminophen (TYLENOL) 500 MG tablet Take 1,000 mg by mouth every 8 (eight) hours as needed (pain).     07/22/19 amLODipine (NORVASC) 2.5 MG tablet Take 5 mg (2 tablets) in the AM. Take 2.5 mg (one tablet) in the evening. 270 tablet 3  . aspirin 81 MG tablet Take 81 mg by mouth daily.     . Cholecalciferol (VITAMIN D3) 50 MCG (2000 UT) capsule Take 2,000 Units by mouth daily.    07/20/19 Cod Liver Oil CAPS Take 1 capsule by mouth daily. Once per day     . Coenzyme Q-10 100 MG capsule Take 100 mg by mouth daily.    . furosemide (LASIX) 20 MG tablet Take 1 tablet (20 mg total) by mouth every other day. 45 tablet 3  . ibuprofen (ADVIL,MOTRIN) 200 MG tablet Take 200 mg by mouth every 8 (eight) hours as needed (pain).     11/23/19 lisinopril (ZESTRIL) 20 MG tablet Take 1 tablet (20 mg total) by mouth daily. 30 tablet 2  . MAGNESIUM PO Take by mouth.    . simvastatin (ZOCOR) 40 MG tablet Take 40 mg by mouth at bedtime.     No current facility-administered medications on file prior to visit.  Allergies  Allergen Reactions  . Prednisone Shortness Of Breath  . Penicillins Rash    Did it involve swelling of the face/tongue/throat, SOB, or low BP? No Did it involve sudden or severe rash/hives, skin peeling, or any reaction on the inside of your mouth or nose? No Did you need to seek medical attention at a hospital or doctor's office? Yes When did it last happen?2016 If all above answers are "NO", may proceed with cephalosporin use.   . Sulfa Antibiotics Nausea Only  . Sulfonamide Derivatives Nausea Only  . Sulfamethoxazole Other (See Comments)    unknown  . Tramadol Nausea And Vomiting     Assessment/Plan:  1. Hypertension -

## 2020-02-22 NOTE — Telephone Encounter (Signed)
Left message for pt to reschedule HTN visit with PharmD that she missed today.

## 2020-02-27 ENCOUNTER — Ambulatory Visit: Payer: Medicare Other | Admitting: Cardiovascular Disease

## 2020-04-10 ENCOUNTER — Telehealth: Payer: Self-pay

## 2020-04-10 NOTE — Telephone Encounter (Signed)
The patient called to inform Dr. Excell Seltzer her husband is not doing well and is in the hospital. She also states he takes care of her and organizes her medications and since he is not there to do it she does not know what to take. Reviewed the patient's medication list in great detail. She states she thinks she is only taking amlodipine 5 mg daily. She will continue amlodipine 5 mg daily and monitor BP. She will call in a few weeks with some blood pressures to see if she needs to add in the 2.5 mg in the afternoon. She was grateful for assistance.

## 2020-04-12 ENCOUNTER — Other Ambulatory Visit: Payer: Self-pay | Admitting: Cardiovascular Disease

## 2020-04-13 MED ORDER — LISINOPRIL 20 MG PO TABS: 20.0000 mg | ORAL_TABLET | Freq: Every day | ORAL | 7 refills | Status: DC

## 2020-04-13 NOTE — Telephone Encounter (Signed)
Called pt's medication lisinopril into pt's pharmacy [facility]. Pharmacist verbalized understanding.

## 2020-04-13 NOTE — Addendum Note (Signed)
Addended by: Margaret Pyle D on: 04/13/2020 03:25 PM   Modules accepted: Orders

## 2020-06-09 IMAGING — DX DG CHEST 1V PORT
1 series · 1 of 1 positions shown · non-contrast
Comparison: 04/20/2006

CLINICAL DATA: Cough

EXAM:
PORTABLE CHEST 1 VIEW

[chest]
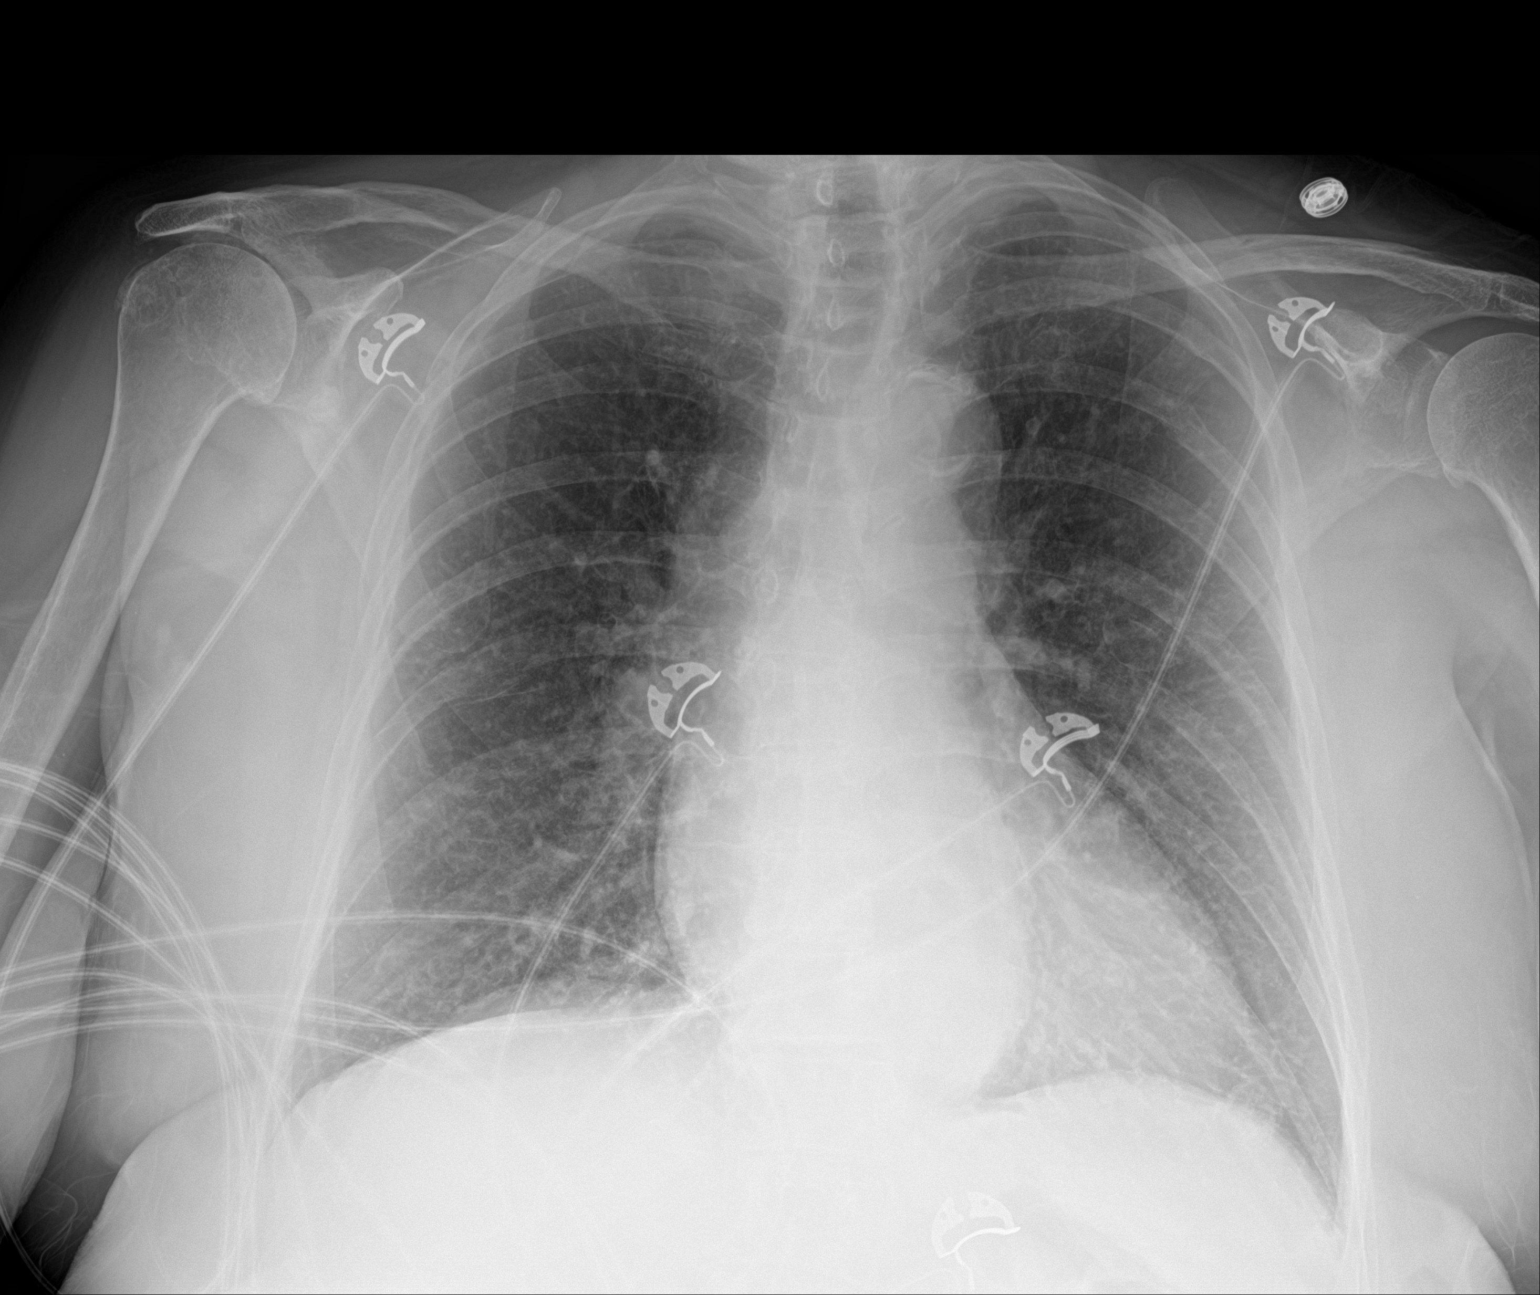

[1 of 1 positions shown; findings below may reference images not displayed]

FINDINGS: There are bilateral Kerley B lines. The heart size is borderline
enlarged. Aortic calcifications are noted. There are bronchitic
changes at the lung bases bilaterally. There is no pneumothorax. No
significant pleural effusion. No acute osseous abnormality. There
are old healed left-sided rib fractures. There are degenerative
changes of both glenohumeral joints, right worse than left.
IMPRESSION: 1. Findings suspicious for developing interstitial pulmonary edema.
An atypical infectious process can have a similar appearance.
2. Borderline cardiomegaly.
3.  Aortic Atherosclerosis (ZXXF1-895.5).

## 2020-06-12 ENCOUNTER — Telehealth: Payer: Self-pay | Admitting: Cardiovascular Disease

## 2020-06-12 DIAGNOSIS — I499 Cardiac arrhythmia, unspecified: Secondary | ICD-10-CM

## 2020-06-12 DIAGNOSIS — R002 Palpitations: Secondary | ICD-10-CM

## 2020-06-12 NOTE — Telephone Encounter (Signed)
Left message to call back  

## 2020-06-12 NOTE — Telephone Encounter (Signed)
Jessica Ware is calling requesting a callback from Orpha Bur to discuss what she has going on and have her make an appt. Please advise.

## 2020-06-15 NOTE — Telephone Encounter (Signed)
Left message to call back  

## 2020-06-15 NOTE — Telephone Encounter (Signed)
She could come in for an EKG but probably higher yield to do a 3 day Zio. thanks

## 2020-06-15 NOTE — Telephone Encounter (Signed)
The patient reports she went to her PCP (Dr. Kathrynn Speed) recently and was told she had an irregular heart rate when her pulse was palpated. She states Dr. Kathrynn Speed instructed her to follow-up with Cardiology.  She states she does not feel palpitations, CP or SOB. She does feel fatigued but that is not new.  She requests an appointment with Dr. Excell Seltzer (and Dr. Excell Seltzer only) to discuss as she is anxious about having a stroke.  Offered the patient an appointment Monday but she declined.  She understands she will be called Monday or Tuesday with Dr. Earmon Phoenix recommendations.   Dr. Flossie Buffy records requested.

## 2020-06-18 ENCOUNTER — Encounter: Payer: Self-pay | Admitting: *Deleted

## 2020-06-18 ENCOUNTER — Ambulatory Visit (INDEPENDENT_AMBULATORY_CARE_PROVIDER_SITE_OTHER): Payer: Medicare Other

## 2020-06-18 DIAGNOSIS — R002 Palpitations: Secondary | ICD-10-CM

## 2020-06-18 DIAGNOSIS — I499 Cardiac arrhythmia, unspecified: Secondary | ICD-10-CM

## 2020-06-18 NOTE — Telephone Encounter (Signed)
The patient agrees to 3 day Zio.  Briefly reviewed instructions and confirmed mailing address and insurance. She was grateful for call and agrees with plan.

## 2020-06-18 NOTE — Progress Notes (Signed)
Patient ID: Jessica Ware, female   DOB: 1935-09-19, 84 y.o.   MRN: [id number] Patient enrolled for [employer] to ship a 3 day ZIO XT long term holter monitor to her home.

## 2020-06-25 DIAGNOSIS — R002 Palpitations: Secondary | ICD-10-CM

## 2020-06-25 DIAGNOSIS — I499 Cardiac arrhythmia, unspecified: Secondary | ICD-10-CM | POA: Diagnosis not present

## 2020-07-10 ENCOUNTER — Telehealth: Payer: Self-pay | Admitting: Cardiovascular Disease

## 2020-07-10 NOTE — Telephone Encounter (Signed)
Per monitor results, "Patient had a min HR of 76 bpm, max HR of 157 bpm, and avg HR of 92 bpm. Predominant underlying rhythm was Sinus Rhythm. Isolated SVEs were rare (<1.0%), SVE Couplets were rare (<1.0%), and no SVE Triplets were present. Isolated VEs were frequent (28.9%,  110740), VE Couplets were rare (<1.0%, 315), and VE Triplets were rare (<1.0%, 103). Ventricular Bigeminy and Trigeminy were present."  Per DPR form, left message for patient that Dr. Excell Seltzer has results and she will be called once he reviews. Left message that preliminarily, the monitor showed mostly NSR with PVCs that are likely the cause of her palpitations. Reiterated she will be called once Dr. Excell Seltzer reviews.

## 2020-07-10 NOTE — Telephone Encounter (Signed)
Patient calling to speak with [facility] for monitor results.

## 2020-07-25 ENCOUNTER — Telehealth: Payer: Self-pay

## 2020-07-25 DIAGNOSIS — I499 Cardiac arrhythmia, unspecified: Secondary | ICD-10-CM

## 2020-07-25 DIAGNOSIS — I493 Ventricular premature depolarization: Secondary | ICD-10-CM

## 2020-07-25 DIAGNOSIS — R002 Palpitations: Secondary | ICD-10-CM

## 2020-07-25 MED ORDER — METOPROLOL TARTRATE 50 MG PO TABS
50.0000 mg | ORAL_TABLET | Freq: Two times a day (BID) | ORAL | 3 refills | Status: DC
Start: 2020-07-25 — End: 2020-11-06

## 2020-07-25 NOTE — Telephone Encounter (Signed)
Confirmed with the patient consult with Dr. Lalla Brothers and echo on 08/14/20. She understands to arrive at 1:45PM. She was grateful for call and agrees with plan.

## 2020-07-25 NOTE — Telephone Encounter (Signed)
-----   Message from Tonny Bollman, MD sent at 07/20/2020 12:59 PM EST ----- Reviewed study. Would trial metoprolol 5 mg BID and refer to EP for evaluation of frequent PVC's with 29% overall burden.

## 2020-07-25 NOTE — Telephone Encounter (Signed)
See monitor result note. Metoprolol 50 mg BID called in for patient and echo ordered.  Will arrange echo and EP visit same day for patient convenience.

## 2020-07-25 NOTE — Telephone Encounter (Signed)
-----   Message from Tonny Bollman, MD sent at 07/25/2020  6:46 AM EST ----- Thanks. I would like her to have a repeat echo as well. Last echo 1 year ago showed LVEF 50-55% (low normal) and need to make sure LV function hasn't declined especially considering the frequency of her PVC's.

## 2020-08-14 ENCOUNTER — Encounter: Payer: Self-pay | Admitting: Cardiology

## 2020-08-14 ENCOUNTER — Ambulatory Visit (HOSPITAL_COMMUNITY): Payer: Medicare Other | Attending: Cardiovascular Disease

## 2020-08-14 ENCOUNTER — Ambulatory Visit: Payer: Medicare Other | Admitting: Cardiology

## 2020-08-14 ENCOUNTER — Other Ambulatory Visit: Payer: Self-pay

## 2020-08-14 VITALS — BP 144/64 | HR 74 | Ht 63.0 in | Wt 148.0 lb

## 2020-08-14 DIAGNOSIS — E785 Hyperlipidemia, unspecified: Secondary | ICD-10-CM | POA: Insufficient documentation

## 2020-08-14 DIAGNOSIS — I493 Ventricular premature depolarization: Secondary | ICD-10-CM

## 2020-08-14 DIAGNOSIS — I10 Essential (primary) hypertension: Secondary | ICD-10-CM | POA: Diagnosis not present

## 2020-08-14 DIAGNOSIS — I5032 Chronic diastolic (congestive) heart failure: Secondary | ICD-10-CM

## 2020-08-14 DIAGNOSIS — E119 Type 2 diabetes mellitus without complications: Secondary | ICD-10-CM | POA: Diagnosis not present

## 2020-08-14 DIAGNOSIS — I499 Cardiac arrhythmia, unspecified: Secondary | ICD-10-CM | POA: Diagnosis not present

## 2020-08-14 DIAGNOSIS — R002 Palpitations: Secondary | ICD-10-CM | POA: Insufficient documentation

## 2020-08-14 LAB — ECHOCARDIOGRAM COMPLETE
Area-P 1/2: 4.44 cm2
Height: 63 in
P 1/2 time: 405 msec
S' Lateral: 3.3 cm
Weight: 2368 oz

## 2020-08-14 NOTE — Patient Instructions (Addendum)
Medication Instructions:  Your physician recommends that you continue on your current medications as directed. Please refer to the Current Medication list given to you today.  Labwork: None ordered.  Testing/Procedures: None ordered.  Follow-Up: Your physician wants you to follow-up in: as needed with Dr. Lambert.    Any Other Special Instructions Will Be Listed Below (If Applicable).  If you need a refill on your cardiac medications before your next appointment, please call your pharmacy.   

## 2020-08-14 NOTE — Progress Notes (Signed)
Electrophysiology Office Note:    Date:  08/14/2020   ID:  Jessica Ware, DOB 1935-12-29, MRN [id number]  PCP:  Karle Plumber, MD  [facility] Cardiologist:  Tonny Bollman, MD  [facility] Electrophysiologist:  None   Referring MD: Tonny Bollman, MD   Chief Complaint: PVCs  History of Present Illness:    Jessica Ware is a 85 y.o. female who presents for an evaluation of PVCs at the request of Dr. Excell Seltzer. Their medical history includes hypertension and chronic diastolic heart failure.  She last saw Dr. Excell Seltzer on Nov 21, 2019.  At that appointment they were working on her antihypertensive therapy.  He increased her amlodipine at that visit and reduced her metoprolol because of bradycardia.  In December 2021, the patient was noted to have an abnormal heart rate from her primary care physician.  A ZIO heart monitor was ordered and was completed on July 20, 2020.  Because of the high burden of PVCs noted on this heart monitor, she is referred to EP.  She is scheduled to have an echocardiogram later today.  Today she tells me she is doing well.  She is very active.  She is frequently working out in the yard on her flowers.  She does not describe any shortness of breath, lightheadedness or dizziness.  She does not perceive the extra heartbeats.    Past Medical History:  Diagnosis Date  . Allergic rhinitis   . Anxiety   . Depression    pt denies  . GERD (gastroesophageal reflux disease)   . History of renal stone   . Hyperlipidemia   . Hypertension   . Obesity   . Status post dilation of esophageal narrowing     Past Surgical History:  Procedure Laterality Date  . ABDOMINAL HYSTERECTOMY    . KIDNEY STONE SURGERY     1985    Current Medications: Current Meds  Medication Sig  . acetaminophen (TYLENOL) 500 MG tablet Take 1,000 mg by mouth every 8 (eight) hours as needed (pain).   Marland Kitchen amLODipine (NORVASC) 2.5 MG tablet Take 5 mg (2 tablets) in the  AM. Take 2.5 mg (one tablet) in the evening.  Marland Kitchen aspirin 81 MG tablet Take 81 mg by mouth daily.  . Cholecalciferol (VITAMIN D3) 50 MCG (2000 UT) capsule Take 2,000 Units by mouth daily.  Marland Kitchen Cod Liver Oil CAPS Take 1 capsule by mouth daily. Once per day  . Coenzyme Q-10 100 MG capsule Take 100 mg by mouth daily.  . furosemide (LASIX) 20 MG tablet Take 1 tablet (20 mg total) by mouth every other day.  . ibuprofen (ADVIL,MOTRIN) 200 MG tablet Take 200 mg by mouth every 8 (eight) hours as needed (pain).   Marland Kitchen lisinopril (ZESTRIL) 20 MG tablet Take 1 tablet (20 mg total) by mouth daily.  Marland Kitchen MAGNESIUM PO Take by mouth.  . metoprolol tartrate (LOPRESSOR) 50 MG tablet Take 1 tablet (50 mg total) by mouth 2 (two) times daily.  . simvastatin (ZOCOR) 40 MG tablet Take 40 mg by mouth at bedtime.     Allergies:   Prednisone, Penicillins, Sulfa antibiotics, Sulfonamide derivatives, Sulfamethoxazole, and Tramadol   Social History   Socioeconomic History  . Marital status: Married    Spouse name: Not on file  . Number of children: 3  . Years of education: Not on file  . Highest education level: Not on file  Occupational History  . 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 Smoking status: Never Smoker  . Smokeless tobacco: Never Used  Substance and Sexual Activity  . Alcohol use: No  . Drug use: No  . Sexual activity: Not on file  Other Topics Concern  . 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         Social Determinants of Health   Financial Resource Strain: Not on file  Food Insecurity: Not on file  Transportation Needs: Not on file  Physical Activity: Not on file  Stress: Not on file  Social Connections: Not on file     Family History: The patient's family history includes Heart disease in  her father; Heart disease (age of onset: 48) in her mother; Hypertension in her mother; Kidney disease in her father; Prostate cancer in her father.  ROS:   Please see the history of present illness.    All other systems reviewed and are negative.  EKGs/Labs/Other Studies Reviewed:    The following studies were reviewed today:  July 18, 2019 echo personally reviewed Left ventricular function low normal, 50% Right ventricular function normal mild MR  August 05, 2019 ZIO monitor Frequent PVCs, burden 17.3%  July 20, 2020 ZIO personally reviewed Frequent ventricular ectopy, 29%.  Monomorphic.  EKG:  The ekg ordered today demonstrates sinus rhythm with a single PVC.  Rhythm strip shows sinus rhythm with bigeminal monomorphic PVCs.  PVCs have a left bundle branch block pattern in lead V1 and have a precordial transition in lead V3.  These PVCs are narrow and the axis closely mimics her sinus rhythm axis.  Recent Labs: No results found for requested labs within last 8760 hours.  Recent Lipid Panel    Component Value Date/Time   CHOL 192 07/20/2019 1343   TRIG 87 07/20/2019 1343   HDL 72 07/20/2019 1343   CHOLHDL 2.7 07/20/2019 1343   CHOLHDL 3.8 02/26/2016 1332   VLDL 19 02/26/2016 1332   LDLCALC 104 (H) 07/20/2019 1343   LDLDIRECT 112.1 07/03/2011 1046    Physical Exam:    VS:  BP (!) 144/64   Pulse 74   Ht 5\' 3"  (1.6 m)   Wt 148 lb (67.1 kg)   SpO2 99%   BMI 26.22 kg/m     Wt Readings from Last 3 Encounters:  08/14/20 148 lb (67.1 kg)  11/21/19 153 lb 6.4 oz (69.6 kg)  07/28/19 150 lb (68 kg)     GEN:  Well nourished, well developed in no acute distress HEENT: Normal NECK: No JVD; No carotid bruits LYMPHATICS: No lymphadenopathy CARDIAC: RRR, no murmurs, rubs, gallops RESPIRATORY:  Clear to auscultation without rales, wheezing or rhonchi  ABDOMEN: Soft, non-tender, non-distended MUSCULOSKELETAL:  No edema; No deformity  SKIN: Warm and dry NEUROLOGIC:   Alert and oriented x 3 PSYCHIATRIC:  Normal affect   ASSESSMENT:    No diagnosis found. PLAN:    In order of problems listed above:  1. Frequent PVCs Asymptomatic.  She has a very high burden of these PVCs.  They are monomorphic.  The morphology of the PVC suggests it is originating from the para-hisian region.  This limits the ability to address this PVC with an invasive approach without risking the need for permanent pacing. Despite the high burden  of her PVCs, she is asymptomatic and very active.  I will plan to follow-up today's echocardiogram.  As long as her left ventricular function has remained stable, favor conservative approach.  Would recommend that we continue the metoprolol twice daily.  I would not pursue antiarrhythmic therapy unless her left ventricular function has shown a significant reduction or if she develops symptoms related to the PVCs in the future.  2.  Chronic diastolic heart failure Warm and dry on exam today.  NYHA class II symptoms. Continue current medical therapy including Lasix, lisinopril, metoprolol.  Follow-up as needed/based on today's echo.   Medication Adjustments/Labs and Tests Ordered: Current medicines are reviewed at length with the patient today.  Concerns regarding medicines are outlined above.  No orders of the defined types were placed in this encounter.  No orders of the defined types were placed in this encounter.    Signed, Steffanie Dunn, MD, [facility]  08/14/2020 2:06 PM    Electrophysiology [facility]

## 2020-08-31 ENCOUNTER — Other Ambulatory Visit: Payer: Self-pay

## 2020-08-31 MED ORDER — FUROSEMIDE 20 MG PO TABS: 20.0000 mg | ORAL_TABLET | ORAL | 3 refills | Status: DC

## 2020-09-30 ENCOUNTER — Other Ambulatory Visit: Payer: Self-pay | Admitting: Cardiovascular Disease

## 2020-10-31 ENCOUNTER — Telehealth: Payer: Self-pay | Admitting: Cardiovascular Disease

## 2020-10-31 NOTE — Telephone Encounter (Signed)
New message:     Patient calling to speak with Katie. I put the patient on the wait list and she is assisting to speak with the nurse.    STAT if HR is under 50 or over 120 (normal HR is 60-100 beats per minute)  1) What is your heart rate? 43 to like 45  2) Do you have a log of your heart rate readings (document readings)? Patient she do not have all of them  3) Do you have any other symptoms? Tired

## 2020-10-31 NOTE — Telephone Encounter (Signed)
Will review at her upcoming appt

## 2020-10-31 NOTE — Telephone Encounter (Signed)
Spoke to the patient who requests yearly appointments with Dr. Excell Seltzer for herself and her husband. Scheduled them 11/06/20.  While on the phone, she states her HR has been low when she checks her BP with readings in the high 40s. She is asymptomatic other than being tired but "no more than usual."  Zio patch in December 2021 showed frequent PVCs and metoprolol 50 mg BID was initiated. She had a consult with Dr. Lalla Brothers February 2022 and new new orders were given as her EKG showed a single PVC. Her echo from Feb 2022 was reassuring. She does not feel like her HR is irregular.  Will route to Dr. Excell Seltzer for any recommendations prior to 5/10 visit.

## 2020-11-06 ENCOUNTER — Other Ambulatory Visit: Payer: Self-pay

## 2020-11-06 ENCOUNTER — Ambulatory Visit: Payer: Medicare Other | Admitting: Cardiovascular Disease

## 2020-11-06 ENCOUNTER — Encounter: Payer: Self-pay | Admitting: Cardiovascular Disease

## 2020-11-06 VITALS — BP 134/80 | HR 74 | Ht 62.5 in | Wt 151.6 lb

## 2020-11-06 DIAGNOSIS — I10 Essential (primary) hypertension: Secondary | ICD-10-CM | POA: Diagnosis not present

## 2020-11-06 DIAGNOSIS — I493 Ventricular premature depolarization: Secondary | ICD-10-CM | POA: Diagnosis not present

## 2020-11-06 DIAGNOSIS — I5032 Chronic diastolic (congestive) heart failure: Secondary | ICD-10-CM | POA: Diagnosis not present

## 2020-11-06 DIAGNOSIS — E782 Mixed hyperlipidemia: Secondary | ICD-10-CM

## 2020-11-06 LAB — CBC WITH DIFFERENTIAL/PLATELET
Basophils Absolute: 0.1 10*3/uL (ref 0.0–0.2)
Basos: 1 %
EOS (ABSOLUTE): 0.1 10*3/uL (ref 0.0–0.4)
Eos: 2 %
Hematocrit: 41.2 % (ref 34.0–46.6)
Hemoglobin: 13.6 g/dL (ref 11.1–15.9)
Immature Grans (Abs): 0 10*3/uL (ref 0.0–0.1)
Immature Granulocytes: 0 %
Lymphocytes Absolute: 1.6 10*3/uL (ref 0.7–3.1)
Lymphs: 23 %
MCH: 30.4 pg (ref 26.6–33.0)
MCHC: 33 g/dL (ref 31.5–35.7)
MCV: 92 fL (ref 79–97)
Monocytes Absolute: 1 10*3/uL — ABNORMAL HIGH (ref 0.1–0.9)
Monocytes: 14 %
Neutrophils Absolute: 4.3 10*3/uL (ref 1.4–7.0)
Neutrophils: 60 %
Platelets: 233 10*3/uL (ref 150–450)
RBC: 4.47 x10E6/uL (ref 3.77–5.28)
RDW: 15 % (ref 11.7–15.4)
WBC: 7 10*3/uL (ref 3.4–10.8)

## 2020-11-06 LAB — LIPID PANEL
Chol/HDL Ratio: 2.7 ratio (ref 0.0–4.4)
Cholesterol, Total: 181 mg/dL (ref 100–199)
HDL: 67 mg/dL (ref >39)
LDL Chol Calc (NIH): 96 mg/dL (ref 0–99)
Triglycerides: 99 mg/dL (ref 0–149)
VLDL Cholesterol Cal: 18 mg/dL (ref 5–40)

## 2020-11-06 LAB — COMPREHENSIVE METABOLIC PANEL
ALT: 9 IU/L (ref 0–32)
AST: 22 IU/L (ref 0–40)
Albumin/Globulin Ratio: 2.2 (ref 1.2–2.2)
Albumin: 4.6 g/dL (ref 3.6–4.6)
Alkaline Phosphatase: 141 IU/L — ABNORMAL HIGH (ref 44–121)
BUN/Creatinine Ratio: 24 (ref 12–28)
BUN: 24 mg/dL (ref 8–27)
Bilirubin Total: 0.9 mg/dL (ref 0.0–1.2)
CO2: 27 mmol/L (ref 20–29)
Calcium: 10 mg/dL (ref 8.7–10.3)
Chloride: 101 mmol/L (ref 96–106)
Creatinine, Ser: 0.99 mg/dL (ref 0.57–1.00)
Globulin, Total: 2.1 g/dL (ref 1.5–4.5)
Glucose: 93 mg/dL (ref 65–99)
Potassium: 4.2 mmol/L (ref 3.5–5.2)
Sodium: 142 mmol/L (ref 134–144)
Total Protein: 6.7 g/dL (ref 6.0–8.5)
eGFR: 57 mL/min/{1.73_m2} — ABNORMAL LOW (ref >59)

## 2020-11-06 LAB — TSH: TSH: 3.33 u[IU]/mL (ref 0.450–4.500)

## 2020-11-06 NOTE — Progress Notes (Signed)
Cardiology Office Note:    Date:  11/06/2020   ID:  Jessica Ware, DOB [birth year], MRN [id number]  PCP:  Karle Plumber, MD   [facility] Providers Cardiologist:  Tonny Bollman, MD Electrophysiologist:  Lanier Prude, MD     Referring MD: Karle Plumber, MD   Chief Complaint  Patient presents with  . Hypertension    History of Present Illness:    Jessica Ware is a 85 y.o. female with a hx of hypertension, mixed hyperlipidemia, chronic diastolic heart failure, presenting for follow-up evaluation.  Saw the patient last 1 year ago.  She was hospitalized in January 2021 with acute diastolic heart failure and she had a brisk response to IV Lasix.  She has remained on 20 mg of furosemide every other day.  The patient has not had any recent problems with shortness of breath, orthopnea, PND, or leg swelling.  Her blood pressure has been controlled.  She has had some low heart rate readings on her blood pressure cuff and pulse oximeter monitor.  Her monitor demonstrated very frequent PVCs.  She was referred for formal EP evaluation and was felt to be asymptomatic with preserved LV function.  Conservative management was recommended.  She has been started on a beta-blocker and seems to be tolerating this well.  Today she denies any chest pain or pressure, shortness of breath, lightheadedness, or syncope.  No recent heart palpitations.  She does complain of generalized fatigue and lethargy at times.  Past Medical History:  Diagnosis Date  . Allergic rhinitis   . Anxiety   . Depression    pt denies  . GERD (gastroesophageal reflux disease)   . History of renal stone   . Hyperlipidemia   . Hypertension   . Obesity   . Status post dilation of esophageal narrowing     Past Surgical History:  Procedure Laterality Date  . ABDOMINAL HYSTERECTOMY    . KIDNEY STONE SURGERY     1985    Current Medications: Current Meds  Medication Sig  . acetaminophen  (TYLENOL) 500 MG tablet Take 1,000 mg by mouth every 8 (eight) hours as needed (pain).   Marland Kitchen amLODipine (NORVASC) 2.5 MG tablet Take 5 mg (2 tablets) in the AM. Take 2.5 mg (one tablet) in the evening.  Marland Kitchen aspirin 81 MG tablet Take 81 mg by mouth daily.  . Cholecalciferol (VITAMIN D3) 50 MCG (2000 UT) capsule Take 2,000 Units by mouth daily.  . Coenzyme Q-10 100 MG capsule Take 100 mg by mouth daily.  . diclofenac Sodium (VOLTAREN) 1 % GEL SMARTSIG:2.5 Gram(s) Topical 4 Times Daily  . furosemide (LASIX) 20 MG tablet Take 1 tablet (20 mg total) by mouth every other day.  . lisinopril (ZESTRIL) 20 MG tablet TAKE ONE TABLET BY MOUTH EVERY DAY  . MAGNESIUM-OXIDE 400 (241.3 Mg) MG tablet Take 1 tablet by mouth 2 (two) times daily.  . metoprolol succinate (TOPROL-XL) 25 MG 24 hr tablet Take by mouth.  . simvastatin (ZOCOR) 40 MG tablet Take 40 mg by mouth at bedtime.     Allergies:   Prednisone, Penicillins, Sulfa antibiotics, Sulfonamide derivatives, Sulfamethoxazole, and Tramadol   Social History   Socioeconomic History  . Marital status: Married    Spouse name: Not on file  . Number of children: 3  . Years of education: Not on file  . Highest education level: Not on file  Occupational History  . 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 Smoking status: Never Smoker  . Smokeless tobacco: Never Used  Substance and Sexual Activity  . Alcohol use: No  . Drug use: No  . Sexual activity: Not on file  Other Topics Concern  . 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         Social Determinants of Health   Financial Resource Strain: Not on file  Food Insecurity: Not on file  Transportation Needs: Not on file  Physical Activity: Not on file  Stress: Not on file  Social Connections: Not on file      Family History: The patient's family history includes Heart disease in her father; Heart disease (age of onset: 48) in her mother; Hypertension in her mother; Kidney disease in her father; Prostate cancer in her father.  ROS:   Please see the history of present illness.    All other systems reviewed and are negative.  EKGs/Labs/Other Studies Reviewed:    The following studies were reviewed today: Echo 08/14/2020: 1. Left ventricular ejection fraction, by estimation, is 55 to 60%. The  left ventricle has normal function. The left ventricle has no regional  wall motion abnormalities. Left ventricular diastolic parameters are  indeterminate.  2. Right ventricular systolic function is normal. The right ventricular  size is normal. There is normal pulmonary artery systolic pressure.  3. The mitral valve is grossly normal. Mild mitral valve regurgitation.  4. The aortic valve is grossly normal. Aortic valve regurgitation is not  visualized.   EKG:  EKG is ordered today.  The ekg ordered today demonstrates normal sinus rhythm 74 bpm, frequent PVCs.  Otherwise normal.  Recent Labs: No results found for requested labs within last 8760 hours.  Recent Lipid Panel    Component Value Date/Time   CHOL 192 07/20/2019 1343   TRIG 87 07/20/2019 1343   HDL 72 07/20/2019 1343   CHOLHDL 2.7 07/20/2019 1343   CHOLHDL 3.8 02/26/2016 1332   VLDL 19 02/26/2016 1332   LDLCALC 104 (H) 07/20/2019 1343   LDLDIRECT 112.1 07/03/2011 1046     Risk Assessment/Calculations:       Physical Exam:    VS:  BP 134/80   Pulse 74   Ht 5' 2.5" (1.588 m)   Wt 151 lb 9.6 oz (68.8 kg)   SpO2 95%   BMI 27.29 kg/m     Wt Readings from Last 3 Encounters:  11/06/20 151 lb 9.6 oz (68.8 kg)  08/14/20 148 lb (67.1 kg)  11/21/19 153 lb 6.4 oz (69.6 kg)     GEN:  Well nourished, well developed in no acute distress HEENT: Normal NECK: No JVD; No carotid bruits LYMPHATICS: No  lymphadenopathy CARDIAC: RRR, premature beats noted, 2/6 systolic ejection murmur at the right upper sternal border RESPIRATORY:  Clear to auscultation without rales, wheezing or rhonchi  ABDOMEN: Soft, non-tender, non-distended MUSCULOSKELETAL: Trace bilateral pretibial edema; No deformity  SKIN: Warm and dry NEUROLOGIC:  Alert and oriented x 3 PSYCHIATRIC:  Normal affect   ASSESSMENT:    1. Mixed hyperlipidemia   2. Essential hypertension   3. Chronic diastolic heart failure (HCC)   4. Frequent PVCs    PLAN:    In order of problems listed above:  1. Check lipids  and LFTs today.  Treated with simvastatin. 2. Blood pressure controlled on amlodipine, lisinopril, and metoprolol tartrate. 3. Volume status appears stable.  I reviewed her most recent echocardiogram which shows normal LV function and no significant valvular disease (mild MR noted).  Continue current management.  Overall seems to be doing well and having no symptoms at present. 4. Formal EP evaluation reviewed.  Conservative management plan.  Fortunately she has normal LV function.  She remains on beta-blockers.  Check her electrolytes.  We will also check a CBC and thyroid studies because of her fatigue.   Medication Adjustments/Labs and Tests Ordered: Current medicines are reviewed at length with the patient today.  Concerns regarding medicines are outlined above.  Orders Placed This Encounter  Procedures  . CBC with Differential/Platelet  . Comprehensive metabolic panel  . Lipid panel  . TSH  . EKG 12-Lead   No orders of the defined types were placed in this encounter.   Patient Instructions  Medication Instructions:  Your provider recommends that you continue on your current medications as directed. Please refer to the Current Medication list given to you today.   *If you need a refill on your cardiac medications before your next appointment, please call your pharmacy*  Lab Work: TODAY! CBC, CMET, lipids,  TSH If you have labs (blood work) drawn today and your tests are completely normal, you will receive your results only by: Marland Kitchen MyChart Message (if you have MyChart) OR . A paper copy in the mail If you have any lab test that is abnormal or we need to change your treatment, we will call you to review the results.  Follow-Up: At [facility], you and your health needs are our priority.  As part of our continuing mission to provide you with exceptional heart care, we have created designated Provider Care Teams.  These Care Teams include your primary Cardiologist (physician) and Advanced Practice Providers (APPs -  Physician Assistants and Nurse Practitioners) who all work together to provide you with the care you need, when you need it. Your next appointment:   12 month(s) The format for your next appointment:   In Person Provider:   You may see Tonny Bollman, MD or one of the following Advanced Practice Providers on your designated Care Team:    Tereso Newcomer, PA-C  [name], [place]      Signed, Tonny Bollman, MD  11/06/2020 10:16 AM    [facility]

## 2020-11-06 NOTE — Patient Instructions (Signed)
Medication Instructions:  Your provider recommends that you continue on your current medications as directed. Please refer to the Current Medication list given to you today.   *If you need a refill on your cardiac medications before your next appointment, please call your pharmacy*  Lab Work: TODAY! CBC, CMET, lipids, TSH If you have labs (blood work) drawn today and your tests are completely normal, you will receive your results only by: Marland Kitchen MyChart Message (if you have MyChart) OR . A paper copy in the mail If you have any lab test that is abnormal or we need to change your treatment, we will call you to review the results.  Follow-Up: At [facility], you and your health needs are our priority.  As part of our continuing mission to provide you with exceptional heart care, we have created designated Provider Care Teams.  These Care Teams include your primary Cardiologist (physician) and Advanced Practice Providers (APPs -  Physician Assistants and Nurse Practitioners) who all work together to provide you with the care you need, when you need it. Your next appointment:   12 month(s) The format for your next appointment:   In Person Provider:   You may see Tonny Bollman, MD or one of the following Advanced Practice Providers on your designated Care Team:    Tereso Newcomer, PA-C  Vin Havelock, [place]

## 2021-01-10 ENCOUNTER — Other Ambulatory Visit: Payer: Self-pay | Admitting: Cardiovascular Disease

## 2021-02-03 ENCOUNTER — Other Ambulatory Visit: Payer: Self-pay | Admitting: Cardiovascular Disease

## 2021-05-09 ENCOUNTER — Telehealth: Payer: Self-pay | Admitting: Cardiovascular Disease

## 2021-05-09 NOTE — Telephone Encounter (Signed)
Pt is asking to have her yearly lab orders placed that Dr. Excell Seltzer always does for her.   Pt is not due to see Dr. Excell Seltzer for her yearly visit until May 2023. Informed the pt that looking back at her most recent labs done on 11/21/20, everything looked good and Dr. Excell Seltzer advised for her to continue her current regimen and did not order for her to have repeat blood work based off of those results.  Informed the pt that being she just had full panel done in May and results were normal, there is no need for Dr. Excell Seltzer to order for her to have labs done at this present time.  Informed the pt that she is recalled to see him back for follow-up in May 2023, and when this appt is scheduled is when she will be due for her yearly labs.  Informed the pt that we can order her yearly labs at that time, or prior to her OV appt with Dr. Excell Seltzer in May 2023. Pt verbalized understanding and agrees with this plan.  Below is last result note from Dr. Excell Seltzer on pts labs from Nov 21, 2020:  Jessica Bollman, MD  11/21/2020  8:40 AM EDT     Labs look good. Continue current Rx. thanks

## 2021-05-09 NOTE — Telephone Encounter (Signed)
New Message:     Patient wants to know if she have a lab order. She wants to come in next week when her husband comes for his lab work.Wife says patient is having his lab work  please.

## 2021-09-05 ENCOUNTER — Other Ambulatory Visit: Payer: Self-pay | Admitting: Cardiology

## 2021-09-24 ENCOUNTER — Telehealth: Payer: Self-pay | Admitting: Cardiovascular Disease

## 2021-09-24 NOTE — Telephone Encounter (Signed)
New Message: ? ? ? ? ?Patient said she had an Echo 2 weeks ago at [facility]. She said she would like for you to request that Echo please. She wants to make sure that Excell Seltzer get a copy and see the results please.  ? ? ? ? ? ?

## 2021-09-24 NOTE — Telephone Encounter (Signed)
Pt c/o swelling: STAT is pt has developed SOB within 24 hours ? ?If swelling, where is the swelling located? Feet and ankles- she said  this started about 2 weeks ago ? ?How much weight have you gained and in what time span? do not know ? ?Have you gained 3 pounds in a day or 5 pounds in a week? Do not know ? ?Do you have a log of your daily weights (if so, list)?  no ? ?Are you currently taking a fluid pill? yes ? ?Are you currently SOB? no ? ?Have you traveled recently?  Went to [place] ? ? ?

## 2021-09-24 NOTE — Telephone Encounter (Signed)
Called and spoke directly to patient who states she knows she's not been drinking enough fluid and states that she has not increased her salt intake and has noticed increased swelling in her bilateral ankles and feet "last couple of weeks" states "on my feet right much because my brother is in the hospital." Worse at end of day after having been on her feet, but states "its not normal quite when I wake up." Does use compression stockings, but doesn't elevate legs. Does not know her weight, but states that she usually wears 7.5 size shoes and has recently went and purchased a new pair that she required size 9, but changed brands. ? ?Stated she had a recent ECHO and labs at her PCP office that showed fluid around heart and showed problems with her kidneys. Will call Dr Flossie Buffy office now to request results so that Dr Excell Seltzer has all information to make a clinical decision. Pt does state that she has appt with Arvind in 2 days, but wanted Dr Excell Seltzer to be aware of everything. ?

## 2021-09-25 NOTE — Telephone Encounter (Signed)
Ok let me know when results are available I'm happy to review. Echo last year was essentially normal.  ?

## 2021-10-05 ENCOUNTER — Other Ambulatory Visit: Payer: Self-pay | Admitting: Cardiovascular Disease

## 2021-10-07 ENCOUNTER — Other Ambulatory Visit: Payer: Self-pay | Admitting: Cardiovascular Disease

## 2021-10-09 ENCOUNTER — Telehealth: Payer: Self-pay | Admitting: Cardiovascular Disease

## 2021-10-09 MED ORDER — METOPROLOL SUCCINATE ER 25 MG PO TB24: 25.0000 mg | ORAL_TABLET | Freq: Every day | ORAL | 0 refills | Status: DC

## 2021-10-09 NOTE — Telephone Encounter (Signed)
Pt's medication was sent to pt's pharmacy as requested. Confirmation received.  °

## 2021-10-09 NOTE — Telephone Encounter (Signed)
?*  STAT* If patient is at the pharmacy, call can be transferred to refill team. ? ? ?1. Which medications need to be refilled? (please list name of each medication and dose if known) new prescription for Metoprolol ? ?2. Which pharmacy/location (including street and city if local pharmacy) is medication to be sent to? [facility] [phone] ? ?3. Do they need a 30 day or 90 day supply? 90 days and refills ? ?

## 2021-10-14 ENCOUNTER — Telehealth: Payer: Self-pay | Admitting: Cardiovascular Disease

## 2021-10-14 DIAGNOSIS — R600 Localized edema: Secondary | ICD-10-CM

## 2021-10-14 NOTE — Telephone Encounter (Signed)
Swelling in her feet and ankles for "a few weeks" states that she's been watching her salt intake and hasn't missed any doses of fluid pill. Condones pain to R calf area when she is walking that previously was felt in popliteal region. No redness, denies swelling worse on this side, no warmth. Condones muscle pain in arms and shoulders present "for a while" that she thinks may be caused by her cholesterol medicine, but states that Dr Burt Knack did recently lower the dose of this and she hasn't begin the new dosing to be able to tell if that would help. States the swelling subsides after resting and elevating extremities for a while and is much better upon getting out of bed in the AM, but it returns throughout the day and after having been on her feet. ?

## 2021-10-14 NOTE — Addendum Note (Signed)
Addended by: Lars Mage on: 10/14/2021 12:43 PM ? ? Modules accepted: Orders ? ?

## 2021-10-14 NOTE — Telephone Encounter (Signed)
Pt c/o swelling: STAT is pt has developed SOB within 24 hours ? ?If swelling, where is the swelling located? Ankles and feet  ? ?How much weight have you gained and in what time span?  ? ?Have you gained 3 pounds in a day or 5 pounds in a week?  ? ?Do you have a log of your daily weights (if so, list)? No patient does not weigh herself daily  ? ?Are you currently taking a fluid pill? yes ? ?Are you currently SOB? no ? ?Have you traveled recently? No ? ? ?Patient reports she has not been sleeping well.  ?She wanted to come in and be checked out by Dr. Burt Knack. She has an appt with her PCP today   ?

## 2021-10-14 NOTE — Telephone Encounter (Signed)
Per verbal order of Dr Burt Knack, pt needs a bilateral venous duplex study to evaluate for possible DVT and pt should have OV after study has been completed. ?

## 2021-10-14 NOTE — Telephone Encounter (Signed)
Called and spoke to patient to review recommendation. Pt agrees with plan. Understands that someone will call her to schedule the DVT study and appt has been scheduled to see Dr Excell Seltzer on 5/25 @ 1:20p. ?

## 2021-10-14 NOTE — Telephone Encounter (Signed)
Called Dr Lindwood Qua office and requested copies of recent ECHO and lab work per pt's request for Dr Burt Knack to review. Sent fax request on 09/25/21 as well. ?

## 2021-10-24 ENCOUNTER — Ambulatory Visit (HOSPITAL_COMMUNITY)
Admission: RE | Admit: 2021-10-24 | Discharge: 2021-10-24 | Disposition: A | Discharge status: Home / Self Care | Payer: Medicare Other | Source: Ambulatory Visit | Attending: Cardiology | Admitting: Cardiology

## 2021-10-24 DIAGNOSIS — R600 Localized edema: Secondary | ICD-10-CM | POA: Diagnosis present

## 2021-11-21 ENCOUNTER — Ambulatory Visit: Payer: Medicare Other | Admitting: Cardiovascular Disease

## 2021-11-21 ENCOUNTER — Encounter: Payer: Self-pay | Admitting: Cardiovascular Disease

## 2021-11-21 VITALS — BP 132/84 | HR 75 | Ht 62.5 in | Wt 150.6 lb

## 2021-11-21 DIAGNOSIS — E782 Mixed hyperlipidemia: Secondary | ICD-10-CM

## 2021-11-21 DIAGNOSIS — I5032 Chronic diastolic (congestive) heart failure: Secondary | ICD-10-CM | POA: Diagnosis not present

## 2021-11-21 DIAGNOSIS — I493 Ventricular premature depolarization: Secondary | ICD-10-CM | POA: Diagnosis not present

## 2021-11-21 DIAGNOSIS — I10 Essential (primary) hypertension: Secondary | ICD-10-CM

## 2021-11-21 MED ORDER — METOPROLOL SUCCINATE ER 50 MG PO TB24: 50.0000 mg | ORAL_TABLET | Freq: Every day | ORAL | 3 refills | Status: DC

## 2021-11-21 MED ORDER — FUROSEMIDE 20 MG PO TABS: ORAL_TABLET | ORAL | 0 refills | Status: DC

## 2021-11-21 NOTE — Patient Instructions (Signed)
Medication Instructions:  STOP Amlodipine INCREASE Metoprolol Succinate (Toprol XL) to 50mg  daily INCREASE Furosemide to 20mg  daily for 1 week, then every other day *If you need a refill on your cardiac medications before your next appointment, please call your pharmacy*   Lab Work: BMET, Magnesium Today If you have labs (blood work) drawn today and your tests are completely normal, you will receive your results only by: MyChart Message (if you have MyChart) OR A paper copy in the mail If you have any lab test that is abnormal or we need to change your treatment, we will call you to review the results.   Testing/Procedures: ECHO Your physician has requested that you have an echocardiogram. Echocardiography is a painless test that uses sound waves to create images of your heart. It provides your doctor with information about the size and shape of your heart and how well your heart's chambers and valves are working. This procedure takes approximately one hour. There are no restrictions for this procedure.  Follow-Up: At [facility], you and your health needs are our priority.  As part of our continuing mission to provide you with exceptional heart care, we have created designated Provider Care Teams.  These Care Teams include your primary Cardiologist (physician) and Advanced Practice Providers (APPs -  Physician Assistants and Nurse Practitioners) who all work together to provide you with the care you need, when you need it.  Your next appointment:   1 year(s)  The format for your next appointment:   In Person  Provider:   , MD       Important Information About Sugar

## 2021-11-21 NOTE — Progress Notes (Signed)
Cardiology Office Note:    Date:  11/21/2021   ID:  Jessica Ware, DOB 1935-07-31, MRN [id number]  PCP:  Jessica PlumberArvind, Moogali M, MD   [facility] Providers Cardiologist:  Jessica BollmanMichael Divon Krabill, MD Electrophysiologist:  Jessica PrudeAMERON T LAMBERT, MD     Referring MD: Jessica PlumberArvind, Moogali M, MD   Chief Complaint  Patient presents with   Shortness of Breath    History of Present Illness:    Jessica PeanMildred Louise Ware is a 86 y.o. female with a hx of hypertension, chronic diastolic heart failure, mixed hyperlipidemia, presenting for follow-up evaluation.  She was last seen here in May 2022.  The patient was hospitalized the previous year for acute diastolic heart failure and responded well to IV furosemide.  She has been maintained on every other day furosemide since that time.  The patient is here alone today.  She has been having more problems with swelling in her legs and weight gain.  She feels like she has too much fluid on board.  She states that she does not drink much water.  She follows a low-sodium diet.  She denies orthopnea, PND, shortness of breath, or chest pain.  She is compliant with her medications.  Past Medical History:  Diagnosis Date   Allergic rhinitis    Anxiety    Depression    pt denies   GERD (gastroesophageal reflux disease)    History of renal stone    Hyperlipidemia    Hypertension    Obesity    Status post dilation of esophageal narrowing     Past Surgical History:  Procedure Laterality Date   ABDOMINAL HYSTERECTOMY     KIDNEY STONE SURGERY     1985    Current Medications: Current Meds  Medication Sig   acetaminophen (TYLENOL) 500 MG tablet Take 1,000 mg by mouth every 8 (eight) hours as needed (pain).    aspirin 81 MG tablet Take 81 mg by mouth daily.   Cholecalciferol (VITAMIN D3) 50 MCG (2000 UT) capsule Take 2,000 Units by mouth daily.   Coenzyme Q-10 100 MG capsule Take 100 mg by mouth daily.   diclofenac Sodium (VOLTAREN) 1 % GEL SMARTSIG:2.5 Gram(s)  Topical 4 Times Daily   furosemide (LASIX) 20 MG tablet Take 1 tablet (20 mg total) by mouth every other day.   furosemide (LASIX) 20 MG tablet Take 1 tablet by mouth daily for one week, then decrease to every other day thereafter   lisinopril (ZESTRIL) 20 MG tablet TAKE ONE TABLET BY MOUTH EVERY DAY   MAGNESIUM-OXIDE 400 (241.3 Mg) MG tablet Take 1 tablet by mouth daily.   metoprolol succinate (TOPROL-XL) 50 MG 24 hr tablet Take 1 tablet (50 mg total) by mouth daily. Take with or immediately following a meal.   simvastatin (ZOCOR) 40 MG tablet Take 40 mg by mouth at bedtime.   [DISCONTINUED] amLODipine (NORVASC) 2.5 MG tablet Take 5 mg (2 tablets) by mouth in the AM. Take 2.5 mg (one tablet) in the evening.   [DISCONTINUED] metoprolol succinate (TOPROL-XL) 25 MG 24 hr tablet Take 1 tablet (25 mg total) by mouth daily. Please make yearly appt with Dr. Excell Seltzerooper for May 2023 for future refills. Thank you 1st attempt     Allergies:   Prednisone, Penicillins, Sulfa antibiotics, Sulfonamide derivatives, Sulfamethoxazole, and Tramadol   Social History   Socioeconomic History   Marital status: Married    Spouse name: Not on file   Number of children: 3   Years of education: Not on  file   Highest education level: Not on file  Occupational History   Occupation: retired    Associate Professor: HOMEMAKER  Tobacco Use   Smoking status: Never   Smokeless tobacco: Never  Substance and Sexual Activity   Alcohol use: No   Drug use: No   Sexual activity: Not on file  Other Topics Concern   Not on file  Social History Narrative   HSG, 1 year of college   Married '61   2 sons- '62, '64, 1 dtr-'66; 4 grandchildren   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         Social Determinants of Health   Financial Resource Strain: Not on file  Food Insecurity: Not on file  Transportation Needs: Not on file  Physical Activity: Not on file  Stress: Not on file   Social Connections: Not on file     Family History: The patient's family history includes Heart disease in her father; Heart disease (age of onset: 42) in her mother; Hypertension in her mother; Kidney disease in her father; Prostate cancer in her father.  ROS:   Please see the history of present illness.    All other systems reviewed and are negative.  EKGs/Labs/Other Studies Reviewed:    The following studies were reviewed today: Echo 08/14/2020:  1. Left ventricular ejection fraction, by estimation, is 55 to 60%. The  left ventricle has normal function. The left ventricle has no regional  wall motion abnormalities. Left ventricular diastolic parameters are  indeterminate.   2. Right ventricular systolic function is normal. The right ventricular  size is normal. There is normal pulmonary artery systolic pressure.   3. The mitral valve is grossly normal. Mild mitral valve regurgitation.   4. The aortic valve is grossly normal. Aortic valve regurgitation is not  visualized.   Nuclear Stress Test 07/28/2019: There was no ST segment deviation noted during stress. No T wave inversion was noted during stress. Defect 1: There is a medium defect of mild severity.   Low risk stress nuclear study with a limited area of mild ischemia, corresponding to the distribution of a diagonal or ramus intermedius artery. Non-gated study.  Monitor 07/06/20: Patient had a min HR of 76 bpm, max HR of 157 bpm, and avg HR of 92 bpm. Predominant underlying rhythm was Sinus Rhythm. Isolated SVEs were rare (<1.0%), SVE Couplets were rare (<1.0%), and no SVE Triplets were present. Isolated VEs were frequent (28.9%,  110740), VE Couplets were rare (<1.0%, 315), and VE Triplets were rare (<1.0%, 103). Ventricular Bigeminy and Trigeminy were present.    Study reviewed. Average HR 92 bpm. No sustained arrhythmia. No Afib or flutter. Frequent monomorphic PVC's (29% burden).   EKG:  EKG is ordered today.  The ekg  ordered today demonstrates normal sinus rhythm 75 bpm, occasional PVCs and PACs  Recent Labs: No results found for requested labs within last 8760 hours.  Recent Lipid Panel    Component Value Date/Time   CHOL 181 11/06/2020 1022   TRIG 99 11/06/2020 1022   HDL 67 11/06/2020 1022   CHOLHDL 2.7 11/06/2020 1022   CHOLHDL 3.8 02/26/2016 1332   VLDL 19 02/26/2016 1332   LDLCALC 96 11/06/2020 1022   LDLDIRECT 112.1 07/03/2011 1046     Risk Assessment/Calculations:           Physical Exam:    VS:  BP 132/84   Pulse 75  Ht 5' 2.5" (1.588 Ware)   Wt 150 lb 9.6 oz (68.3 kg)   SpO2 96%   BMI 27.11 kg/Ware     Wt Readings from Last 3 Encounters:  11/21/21 150 lb 9.6 oz (68.3 kg)  11/06/20 151 lb 9.6 oz (68.8 kg)  08/14/20 148 lb (67.1 kg)     GEN:  Well nourished, well developed in no acute distress HEENT: Normal NECK: No JVD; No carotid bruits LYMPHATICS: No lymphadenopathy CARDIAC: RRR, no murmurs, rubs, gallops RESPIRATORY:  Clear to auscultation without rales, wheezing or rhonchi  ABDOMEN: Soft, non-tender, non-distended MUSCULOSKELETAL: 1+ bilateral pretibial and ankle edema; No deformity  SKIN: Warm and dry NEUROLOGIC:  Alert and oriented x 3 PSYCHIATRIC:  Normal affect   ASSESSMENT:    1. Chronic diastolic heart failure (HCC)   2. Essential hypertension   3. Mixed hyperlipidemia   4. Frequent PVCs    PLAN:    In order of problems listed above:  The patient has increasing edema.  She had an echo performed at [facility].  I reviewed this today and it shows normal LV systolic function with no significant valvular disease.  RV function is normal as well.  I have recommended that she discontinue amlodipine, increase metoprolol succinate to 50 mg daily, and increase furosemide to 20 mg daily for 1 week then return to every other day dosing.  We will update her labs today. Blood pressure is controlled.  We will discontinue amlodipine, increase metoprolol  succinate, and check labs as outlined above. Treated with a statin drug.  Last lipids showed an LDL cholesterol of 96 mg/dL.  No known history of CAD. Has undergone formal EP evaluation.  Continue beta-blockade.  Appears to be asymptomatic in this regard.     Medication Adjustments/Labs and Tests Ordered: Current medicines are reviewed at length with the patient today.  Concerns regarding medicines are outlined above.  Orders Placed This Encounter  Procedures   Basic metabolic panel   Magnesium   EKG 12-Lead   ECHOCARDIOGRAM COMPLETE   Meds ordered this encounter  Medications   metoprolol succinate (TOPROL-XL) 50 MG 24 hr tablet    Sig: Take 1 tablet (50 mg total) by mouth daily. Take with or immediately following a meal.    Dispense:  90 tablet    Refill:  3   furosemide (LASIX) 20 MG tablet    Sig: Take 1 tablet by mouth daily for one week, then decrease to every other day thereafter    Dispense:  30 tablet    Refill:  0    Patient Instructions  Medication Instructions:  STOP Amlodipine INCREASE Metoprolol Succinate (Toprol XL) to  daily INCREASE Furosemide to  daily for 1 week, then every other day *If you need a refill on your cardiac medications before your next appointment, please call your pharmacy*   Lab Work: BMET, Magnesium Today If you have labs (blood work) drawn today and your tests are completely normal, you will receive your results only by: MyChart Message (if you have MyChart) OR A paper copy in the mail If you have any lab test that is abnormal or we need to change your treatment, we will call you to review the results.   Testing/Procedures: ECHO Your physician has requested that you have an echocardiogram. Echocardiography is a painless test that uses sound waves to create images of your heart. It provides your doctor with information about the size and shape of your heart and how well your  heart's chambers and valves are working. This procedure  takes approximately one hour. There are no restrictions for this procedure.  Follow-Up: At [facility], you and your health needs are our priority.  As part of our continuing mission to provide you with exceptional heart care, we have created designated Provider Care Teams.  These Care Teams include your primary Cardiologist (physician) and Advanced Practice Providers (APPs -  Physician Assistants and Nurse Practitioners) who all work together to provide you with the care you need, when you need it.  Your next appointment:   1 year(s)  The format for your next appointment:   In Person  Provider:   Tonny Bollman, MD       Important Information About Sugar         Signed, Jessica Bollman, MD  11/21/2021 4:54 PM    [facility]

## 2021-11-22 LAB — BASIC METABOLIC PANEL
BUN/Creatinine Ratio: 19 (ref 12–28)
BUN: 19 mg/dL (ref 8–27)
CO2: 26 mmol/L (ref 20–29)
Calcium: 9.8 mg/dL (ref 8.7–10.3)
Chloride: 101 mmol/L (ref 96–106)
Creatinine, Ser: 0.98 mg/dL (ref 0.57–1.00)
Glucose: 92 mg/dL (ref 70–99)
Potassium: 4.8 mmol/L (ref 3.5–5.2)
Sodium: 143 mmol/L (ref 134–144)
eGFR: 57 mL/min/{1.73_m2} — ABNORMAL LOW (ref >59)

## 2021-11-22 LAB — MAGNESIUM: Magnesium: 2.2 mg/dL (ref 1.6–2.3)

## 2021-12-03 ENCOUNTER — Telehealth: Payer: Self-pay | Admitting: Cardiovascular Disease

## 2021-12-03 DIAGNOSIS — Z79899 Other long term (current) drug therapy: Secondary | ICD-10-CM

## 2021-12-03 NOTE — Telephone Encounter (Signed)
Pt c/o BP issue: STAT if pt c/o blurred vision, one-sided weakness or slurred speech  1. What are your last 5 BP readings?  169/88  2. Are you having any other symptoms (ex. Dizziness, headache, blurred vision, passed out)? No  3. What is your BP issue? Pt states that she thinks that she needs to take a BP medication at night. Pt says that for the last week her BP has been running high at night. Please advise

## 2021-12-04 NOTE — Telephone Encounter (Signed)
Called patient to inform her that she can take either, or both, the Lisinopril and Metoprolol at night, as long as she is taking it consistently at the same time each day. Patient states she had initially began taking it in the morning, but felt like she needed to have something at night, so opted to cut the Metoprolol in half and do half morning and night, but states that her pressure has still been running high. She checks her BP in the AM and provides the following readings: 12/01/21 187/92 12/02/21 168/92 12/03/21 153/80 12/04/21 172/87  Patient denies any symptoms. Asks about appropriate method to take BP and time of day. Advised patient to try taking the whole 50mg  of metoprolol in the evening and take her Lisinopril in the morning and check her BP (on the same arm) one to two hours later and call us back with the readings in 2-3 days.

## 2021-12-06 NOTE — Telephone Encounter (Signed)
Called patient to request BP readings for last 2 days. She is currently eating out for dinner and doesn't have exact figures, but states that its still been high. She reiterates that she was on amlodipine in the past and had bad swelling, but that it did great for her pressure. Will route message to MD for recommendation.

## 2021-12-08 NOTE — Telephone Encounter (Signed)
Please add HCTZ 12.5 mg and check BMET in 2 weeks. thanks

## 2021-12-09 NOTE — Telephone Encounter (Signed)
Called and reviewed recommendations with patient. Pt states she has been on HCTZ in the past and "a water pill is not the answer to my problem. No, I will not do that." Pt requesting that she be given an additional dose of Metoprolol to take in the evening, "or something else, but not a water pill and not that other pill that made my feet swell so bad." Pt states she went back to splitting the Toprol in half and taking 1/2 tablet in the morning with her lisinopril and other half at night (what she was doing initially) and states her BP over the weekend has been better. Provides following readings: 141/76 (sat pm) 147/75 (sun pm) 156/70 (this morning) Advised pt that the difference in results is most likely related to her sodium intake and what she is eating since this is the same regimen she was on when she called in initially with the much higher readings. She denies using "much salt" but does not consider or monitor sodium level in foods she's eating. Will route back to MD for review.

## 2021-12-10 MED ORDER — METOPROLOL SUCCINATE ER 50 MG PO TB24: 50.0000 mg | ORAL_TABLET | Freq: Two times a day (BID) | ORAL | 3 refills | Status: DC

## 2021-12-10 NOTE — Addendum Note (Signed)
Addended by: Lars Mage on: 12/10/2021 05:46 PM   Modules accepted: Orders

## 2021-12-10 NOTE — Telephone Encounter (Signed)
Called and spoke to patient who is hesitant. She states that she understands, will pick up the medicine, but is not sure if she wants to take "that much medicine in a day." States she will continue taking 25mg  each morning and evening and will take an additional 1/2 tablet if needed.

## 2021-12-10 NOTE — Telephone Encounter (Signed)
Ok to try metoprolol succinate 50 mg BID and continue to monitor BP. thx

## 2021-12-12 ENCOUNTER — Other Ambulatory Visit (HOSPITAL_COMMUNITY): Payer: Medicare Other

## 2021-12-24 ENCOUNTER — Other Ambulatory Visit: Payer: Self-pay | Admitting: Cardiovascular Disease

## 2022-01-03 ENCOUNTER — Other Ambulatory Visit: Payer: Self-pay | Admitting: Cardiovascular Disease

## 2022-01-09 ENCOUNTER — Telehealth: Payer: Self-pay | Admitting: Cardiovascular Disease

## 2022-01-09 NOTE — Telephone Encounter (Signed)
Returned call to patient to discuss elevated BP readings. She states that her last 5 BP readings were: 199/99 186/91 180/99 179/86 180/90 She is driving at time of call and does not have exact times or associated dates, but that these readings were some in the morning and some at night. Since last call on 12/03/21 (telephone encounter) she states that she has increased the Metoprolol Succinate to 50mg  BID and still taking the Lisinopril 20mg  each morning as well, and hasn't had much change in her BP. She condones that she has been under a lot of stress lately attributed to her daughter going through a separation. States she does sometimes feel the "smothered" feeling similar to what she felt in the past when she went to the hospital, but that it's not been as bad or as frequent. She says that she is going to increase her Lisinopril and take an additional tablet in the PM until she can be seen because "I don't want to have a stroke and this is dangerous." Got pt added on to DOD schedule for Tuesday 01/14/22. She initally wanted to see only Dr Wednesday, but he is only here on Monday and has 2 overbooks already. Agreed to see Dr Excell Seltzer. Denies symptoms of chest pain/pressure/tightness/SOB. No vision changes or headaches associated with the hypertension. She is requesting an ECHO to follow-up on her "valve problem which could be causing extra fluid and make my pressure go up." Gave ED precautions in the event that her pressure continues to rise or any change of symptoms occur.

## 2022-01-09 NOTE — Telephone Encounter (Signed)
Jessica Ware had a cancellation on his schedule for 7/17, so pt has been added to that slot instead of DOD the following day.

## 2022-01-09 NOTE — Telephone Encounter (Signed)
Pt c/o BP issue: STAT if pt c/o blurred vision, one-sided weakness or slurred speech  1. What are your last 5 BP readings?  199/99 186/91 180/99 179/86 180/90  2. Are you having any other symptoms (ex. Dizziness, headache, blurred vision, passed out)? About 6 in the morning she wakes up and she feels smothered like, she gets up and lays back done and she feels better. Pulse is 61-66 range.  3. What is your BP issue? BP is high.

## 2022-01-13 ENCOUNTER — Encounter: Payer: Self-pay | Admitting: Cardiovascular Disease

## 2022-01-13 ENCOUNTER — Ambulatory Visit: Payer: Medicare Other | Admitting: Cardiovascular Disease

## 2022-01-13 VITALS — BP 140/80 | HR 63 | Ht 63.0 in | Wt 143.8 lb

## 2022-01-13 DIAGNOSIS — I10 Essential (primary) hypertension: Secondary | ICD-10-CM

## 2022-01-13 DIAGNOSIS — I5032 Chronic diastolic (congestive) heart failure: Secondary | ICD-10-CM | POA: Diagnosis not present

## 2022-01-13 NOTE — Patient Instructions (Addendum)
Medication Instructions:  Your physician recommends that you continue on your current medications as directed. Please refer to the Current Medication list given to you today.  *If you need a refill on your cardiac medications before your next appointment, please call your pharmacy*   Lab Work: BMET today If you have labs (blood work) drawn today and your tests are completely normal, you will receive your results only by: MyChart Message (if you have MyChart) OR A paper copy in the mail If you have any lab test that is abnormal or we need to change your treatment, we will call you to review the results.   Testing/Procedures: ECHO Your physician has requested that you have an echocardiogram. Echocardiography is a painless test that uses sound waves to create images of your heart. It provides your doctor with information about the size and shape of your heart and how well your heart's chambers and valves are working. This procedure takes approximately one hour. There are no restrictions for this procedure.  Ambulatory Referral to Pharm D (hypertension) in 2 months   Follow-Up: At [facility], you and your health needs are our priority.  As part of our continuing mission to provide you with exceptional heart care, we have created designated Provider Care Teams.  These Care Teams include your primary Cardiologist (physician) and Advanced Practice Providers (APPs -  Physician Assistants and Nurse Practitioners) who all work together to provide you with the care you need, when you need it.  Your next appointment:   6 month(s)  The format for your next appointment:   In Person  Provider:   Tonny Bollman, MD      Important Information About Sugar

## 2022-01-13 NOTE — Progress Notes (Signed)
Cardiology Office Note:    Date:  01/13/2022   ID:  Jessica Ware, DOB 1935/08/25, MRN [id number]  PCP:  Karle Plumber, MD   [facility] Providers Cardiologist:  Tonny Bollman, MD Electrophysiologist:  Lanier Prude, MD     Referring MD: Karle Plumber, MD   Chief Complaint  Patient presents with   Hypertension    History of Present Illness:    Jessica Ware is a 86 y.o. female with a hx of chronic diastolic heart failure and hypertension, added on today for evaluation of elevated blood pressure.  The patient was just recently seen in May 2023.  At that time, she was having problems with edema.  She discontinued amlodipine and increased her metoprolol succinate.  She then further adjusted her antihypertensive medications and has added lisinopril 20 mg daily.  She recently has had some blood pressure readings in the 180s and 190s.  She just started lisinopril 3 days ago and was noted to have some improvement with blood pressure readings from 146-160/76-89.  She has had some mornings when she has woken up with shortness of breath.  This is subsided on its own within the hour.  She has had no orthopnea, recurrent leg swelling, chest pain, or chest pressure.  Past Medical History:  Diagnosis Date   Allergic rhinitis    Anxiety    Depression    pt denies   GERD (gastroesophageal reflux disease)    History of renal stone    Hyperlipidemia    Hypertension    Obesity    Status post dilation of esophageal narrowing     Past Surgical History:  Procedure Laterality Date   ABDOMINAL HYSTERECTOMY     KIDNEY STONE SURGERY     1985    Current Medications: Current Meds  Medication Sig   acetaminophen (TYLENOL) 500 MG tablet Take 1,000 mg by mouth every 8 (eight) hours as needed (pain).    aspirin 81 MG tablet Take 81 mg by mouth daily.   Cholecalciferol (VITAMIN D3) 50 MCG (2000 UT) capsule Take 2,000 Units by mouth daily.   Coenzyme Q-10 100  MG capsule Take 100 mg by mouth daily.   diclofenac Sodium (VOLTAREN) 1 % GEL SMARTSIG:2.5 Gram(s) Topical 4 Times Daily   furosemide (LASIX) 20 MG tablet Take 1 tablet (20 mg total) by mouth every other day.   furosemide (LASIX) 20 MG tablet Take 1 tablet by mouth daily for one week, then decrease to every other day thereafter   lisinopril (ZESTRIL) 20 MG tablet TAKE ONE TABLET BY MOUTH EVERY DAY   MAGNESIUM-OXIDE 400 (241.3 Mg) MG tablet Take 1 tablet by mouth daily.   metoprolol succinate (TOPROL-XL) 50 MG 24 hr tablet Take 1 tablet (50 mg total) by mouth 2 (two) times daily. Take with or immediately following a meal.   simvastatin (ZOCOR) 40 MG tablet Take 40 mg by mouth at bedtime.     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         Social Determinants of Health   Financial Resource Strain: Not on file  Food Insecurity: Not on file  Transportation Needs: Not on file  Physical Activity: Not on file  Stress: Not on file  Social Connections: Not on file     Family History: The patient's family history includes Heart disease in her father; Heart disease (age of onset: 76) in her mother; Hypertension in her mother; Kidney disease in her father;  Prostate cancer in her father.  ROS:   Please see the history of present illness.    All other systems reviewed and are negative.  EKGs/Labs/Other Studies Reviewed:    The following studies were reviewed today: Echo 08/14/2020:  1. Left ventricular ejection fraction, by estimation, is 55 to 60%. The  left ventricle has normal function. The left ventricle has no regional  wall motion abnormalities. Left ventricular diastolic parameters are  indeterminate.   2. Right ventricular systolic function is normal. The right ventricular  size is normal. There is normal pulmonary artery systolic pressure.   3. The mitral valve is grossly normal. Mild mitral valve regurgitation.   4. The aortic valve is grossly normal. Aortic valve regurgitation is not  visualized.   Monitor 07/06/20: Patch Wear Time:  2 days and 22 hours (2021-12-27T21:44:38-0500 to 2021-12-30T20:35:18-0500)   Patient had a min HR of 76 bpm, max HR of 157 bpm, and avg HR of 92 bpm. Predominant underlying rhythm was Sinus Rhythm. Isolated SVEs were rare (<1.0%), SVE Couplets were rare (<1.0%), and no SVE Triplets were present. Isolated VEs were frequent (28.9%,  110740), VE Couplets were rare (<1.0%, 315), and VE Triplets were rare (<1.0%, 103). Ventricular Bigeminy and Trigeminy were present.    Study reviewed. Average HR 92 bpm. No sustained arrhythmia. No Afib or flutter. Frequent monomorphic PVC's (29% burden).   EKG:  EKG is not ordered today.    Recent Labs: 11/21/2021: BUN 19; Creatinine, Ser 0.98; Magnesium 2.2; Potassium 4.8; Sodium 143  Recent Lipid Panel    Component Value Date/Time   CHOL 181 11/06/2020 1022   TRIG 99 11/06/2020 1022   HDL 67 11/06/2020 1022   CHOLHDL 2.7 11/06/2020 1022   CHOLHDL 3.8 02/26/2016 1332   VLDL 19 02/26/2016 1332   LDLCALC 96 11/06/2020 1022   LDLDIRECT 112.1 07/03/2011 1046     Risk Assessment/Calculations:           Physical Exam:    VS:  BP 140/80   Pulse 63   Ht 5'  3" (1.6 m)   Wt 143 lb 12.8 oz (65.2 kg)   SpO2 98%   BMI 25.47 kg/m     Wt Readings from Last 3 Encounters:  01/13/22 143 lb 12.8 oz (65.2 kg)  11/21/21 150 lb 9.6 oz (68.3 kg)  11/06/20 151 lb 9.6 oz (68.8 kg)     GEN:  Well nourished, well developed in no acute distress HEENT: Normal NECK: No JVD; No carotid bruits LYMPHATICS: No lymphadenopathy CARDIAC: RRR, no murmurs, rubs, gallops RESPIRATORY:  Clear to auscultation without rales, wheezing or rhonchi  ABDOMEN: Soft,  non-tender, non-distended MUSCULOSKELETAL:  No edema; No deformity  SKIN: Warm and dry NEUROLOGIC:  Alert and oriented x 3 PSYCHIATRIC:  Normal affect   ASSESSMENT:    1. Chronic diastolic heart failure (HCC)   2. Essential hypertension    PLAN:    In order of problems listed above:  Some increase in her dyspnea.  Edema actually looks better and her weight is down after stopping amlodipine.  She is taking furosemide every other day.  I have recommended an echocardiogram with the increased dyspnea and early morning shortness of breath that she has experienced.  She has a history of diastolic heart failure in the past. Blood pressure is trending the right direction after starting lisinopril 20 mg daily.  She will continue on metoprolol succinate 50 mg twice daily.  I would like her to continue with blood pressure monitoring and follow-up in our Pharm.D. hypertension clinic in about 8 weeks.  She is advised to bring the readings in with her.  If she remains above goal, I would recommend starting hydrochlorothiazide 25 mg daily and discontinuing furosemide at that time.  Until then, she will remain on her current regimen of lisinopril, metoprolol succinate, and every other day furosemide.  She will continue to follow a low-sodium diet and focus on drinking more water.  We will check a metabolic panel today.     Medication Adjustments/Labs and Tests Ordered: Current medicines are reviewed at length with the patient  today.  Concerns regarding medicines are outlined above.  Orders Placed This Encounter  Procedures   Basic metabolic panel   AMB Referral to [facility] Pharm-D   No orders of the defined types were placed in this encounter.   Patient Instructions  Medication Instructions:  Your physician recommends that you continue on your current medications as directed. Please refer to the Current Medication list given to you today.  *If you need a refill on your cardiac medications before your next appointment, please call your pharmacy*   Lab Work: BMET today If you have labs (blood work) drawn today and your tests are completely normal, you will receive your results only by: MyChart Message (if you have MyChart) OR A paper copy in the mail If you have any lab test that is abnormal or we need to change your treatment, we will call you to review the results.   Testing/Procedures: ECHO Your physician has requested that you have an echocardiogram. Echocardiography is a painless test that uses sound waves to create images of your heart. It provides your doctor with information about the size and shape of your heart and how well your heart's chambers and valves are working. This procedure takes approximately one hour. There are no restrictions for this procedure.  Ambulatory Referral to Pharm D (hypertension) in 2 months   Follow-Up: At [facility], you and your health needs are our priority.  As part of our continuing mission to provide you with exceptional heart care, we have created designated Provider Care Teams.  These Care Teams include your primary Cardiologist (physician) and Advanced Practice Providers (APPs -  Physician Assistants and Nurse Practitioners) who all work together to provide you with the care you need, when you need it.  Your next appointment:   6 month(s)  The format for your next appointment:   In Person  Provider:   Tonny Bollman, MD      Important Information  About Sugar         Signed, Tonny Bollman, MD  01/13/2022 5:31 PM    [facility]

## 2022-01-14 ENCOUNTER — Ambulatory Visit: Payer: Medicare Other | Admitting: Internal Medicine

## 2022-01-14 LAB — BASIC METABOLIC PANEL
BUN/Creatinine Ratio: 21 (ref 12–28)
BUN: 20 mg/dL (ref 8–27)
CO2: 25 mmol/L (ref 20–29)
Calcium: 9.4 mg/dL (ref 8.7–10.3)
Chloride: 101 mmol/L (ref 96–106)
Creatinine, Ser: 0.96 mg/dL (ref 0.57–1.00)
Glucose: 88 mg/dL (ref 70–99)
Potassium: 4.5 mmol/L (ref 3.5–5.2)
Sodium: 141 mmol/L (ref 134–144)
eGFR: 59 mL/min/{1.73_m2} — ABNORMAL LOW (ref >59)

## 2022-01-16 ENCOUNTER — Telehealth: Payer: Self-pay | Admitting: Cardiovascular Disease

## 2022-01-16 NOTE — Telephone Encounter (Signed)
Patient called to get results of tests done on 7/17.

## 2022-01-16 NOTE — Telephone Encounter (Signed)
Informed patient of results and verbal understanding expressed.  

## 2022-01-17 ENCOUNTER — Other Ambulatory Visit: Payer: Self-pay | Admitting: Cardiovascular Disease

## 2022-01-17 ENCOUNTER — Ambulatory Visit (HOSPITAL_COMMUNITY): Payer: Medicare Other | Attending: Cardiovascular Disease

## 2022-01-17 DIAGNOSIS — I10 Essential (primary) hypertension: Secondary | ICD-10-CM | POA: Insufficient documentation

## 2022-01-17 DIAGNOSIS — I493 Ventricular premature depolarization: Secondary | ICD-10-CM | POA: Diagnosis not present

## 2022-01-17 DIAGNOSIS — I5032 Chronic diastolic (congestive) heart failure: Secondary | ICD-10-CM | POA: Diagnosis not present

## 2022-01-17 DIAGNOSIS — E782 Mixed hyperlipidemia: Secondary | ICD-10-CM | POA: Diagnosis not present

## 2022-01-17 LAB — ECHOCARDIOGRAM COMPLETE
Area-P 1/2: 2.76 cm2
S' Lateral: 2.6 cm

## 2022-01-20 ENCOUNTER — Other Ambulatory Visit: Payer: Self-pay | Admitting: Cardiovascular Disease

## 2022-01-20 DIAGNOSIS — I493 Ventricular premature depolarization: Secondary | ICD-10-CM

## 2022-01-20 DIAGNOSIS — I5032 Chronic diastolic (congestive) heart failure: Secondary | ICD-10-CM

## 2022-01-20 DIAGNOSIS — E782 Mixed hyperlipidemia: Secondary | ICD-10-CM

## 2022-01-20 DIAGNOSIS — I10 Essential (primary) hypertension: Secondary | ICD-10-CM

## 2022-01-22 ENCOUNTER — Telehealth: Payer: Self-pay

## 2022-01-22 DIAGNOSIS — I071 Rheumatic tricuspid insufficiency: Secondary | ICD-10-CM

## 2022-01-22 NOTE — Telephone Encounter (Signed)
-----   Message from Tonny Bollman, MD sent at 01/21/2022 12:26 PM EDT ----- Overall good study result. Normal LV and RV function. Moderate TR noted and she should have a follow-up echo in one year to follow this. No severe valve disease. Would continue current medical Rx. thx

## 2022-01-22 NOTE — Telephone Encounter (Signed)
Patient aware of results and repeat ECHO order has been placed at this time for TR.

## 2022-03-04 ENCOUNTER — Other Ambulatory Visit: Payer: Self-pay

## 2022-03-04 ENCOUNTER — Telehealth: Payer: Self-pay | Admitting: Cardiovascular Disease

## 2022-03-04 NOTE — Telephone Encounter (Signed)
Pt's pharmacy calling requesting a refill on lisinopril, pt stated that she takes it BID. I do not see where this medication was increased. Please address

## 2022-03-04 NOTE — Telephone Encounter (Signed)
Pt c/o BP issue: STAT if pt c/o blurred vision, one-sided weakness or slurred speech  1. What are your last 5 BP readings? 162/75, yesterday 175/85, 168/86, 157/84  2. Are you having any other symptoms (ex. Dizziness, headache, blurred vision, passed out)? No symptoms  3. What is your BP issue? Blood pressure is running high

## 2022-03-05 ENCOUNTER — Other Ambulatory Visit: Payer: Self-pay

## 2022-03-05 NOTE — Telephone Encounter (Signed)
Returned call to patient. She states that she has been taking her Lisinopril 20mg  tablets morning and evening since her OV on 01/13/22. She confirms she is still taking her Metoprolol Succinate 50mg  twice daily as well. She states since doing this her BP has been good. She checks her pressure a few times each week and provides the following BP log: (has no specific dates per reading) 126/84, 137/73, 137/79, 126/78, 119/77, 144/77 (states she ate "bad" this day). She needs a refill on Lisinopril and pharmacy is telling her it's too soon to fill. Per OV note: Blood pressure is trending the right direction after starting lisinopril 20 mg daily.  She will continue on metoprolol succinate 50 mg twice daily.  I would like her to continue with blood pressure monitoring and follow-up in our Pharm.D. hypertension clinic in about 8 weeks.  She is advised to bring the readings in with her.  If she remains above goal, I would recommend starting hydrochlorothiazide 25 mg daily and discontinuing furosemide at that time.  Until then, she will remain on her current regimen of lisinopril, metoprolol succinate, and every other day furosemide.  She will continue to follow a low-sodium diet and focus on drinking more water.  We will check a metabolic panel today.  Pt has PharmD appt scheduled for 03/17/22. Will route to MD for final approval on Lisinopril increase.

## 2022-03-05 NOTE — Telephone Encounter (Signed)
Yes- this is fine; thanks- 

## 2022-03-06 ENCOUNTER — Other Ambulatory Visit: Payer: Self-pay

## 2022-03-06 MED ORDER — LISINOPRIL 20 MG PO TABS: 20.0000 mg | ORAL_TABLET | Freq: Two times a day (BID) | ORAL | 3 refills | Status: DC

## 2022-03-06 NOTE — Telephone Encounter (Signed)
Medication has already been addressed. Thanks

## 2022-03-07 NOTE — Telephone Encounter (Signed)
Lisinopril ordered at 20mg  twice daily (per pt preference) and sent to pharmacy on file. Pt made aware.

## 2022-03-16 NOTE — Progress Notes (Unsigned)
HPI: Jessica Ware is a 86 y.o. female referred by Dr. Burt Knack to HTN clinic. PMH is significant for hypertension, diastolic heart failure and hypertension. Pt was seen by Dr. Burt Knack on 01/13/22 where the pt reported improvement in edema after stopping amlodipine. At that visit, given improvement in BP after starting lisinopril 20 mg daily, an echocardiogram was ordered and the patient was scheduled to monitor home BP readings and return to clinic in 8 weeks for PharmD hypertension clinic. Dr. Burt Knack recommended the addition of HCTZ and cessation of furosemide if BP remains elevated at follow-up. Labs at that time showed creatinine of 0.96 and normal electrolytes. ECHO on 01/17/22 showed LVEF of 55-60% with grade I diastolic dysfunction. On 9/5 via telephone, the patient reported better control of BP since taking metoprolol 50 mg twice daily and lisinopril 20 mg twice daily, however readings were only at goal around 50% of the time.   Pt presents today in good spirits  Current HTN meds: metopolol ER 50 mg BID, lisinopril 20 mg BID, furosemide 20 mg every other day Previously tried: amlodipine (swelling) BP goal: <130/80  Family History:  Family History  Problem Relation Age of Onset   Heart disease Mother 21       MI   Hypertension Mother    Heart disease Father    Kidney disease Father    Prostate cancer Father    Social History:  Social History   Socioeconomic History   Marital status: Married    Spouse name: Not on file   Number of children: 3   Years of education: Not on file   Highest education level: Not on file  Occupational History   Occupation: retired    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         Social Determinants of Health   Financial Resource Strain: Not on file  Food Insecurity: Not on file  Transportation Needs: Not on file  Physical Activity: Not on file  Stress: Not on file  Social Connections: Not on file  Intimate Partner Violence: Not on file    Diet:   Exercise:   Home BP readings:   Wt Readings from Last 3 Encounters:  01/13/22 143 lb 12.8 oz (65.2 kg)  11/21/21 150 lb 9.6 oz (68.3 kg)  11/06/20 151 lb 9.6 oz (68.8 kg)   BP Readings from Last 3 Encounters:  01/13/22 140/80  11/21/21 132/84  11/06/20 134/80   Pulse Readings from Last 3 Encounters:  01/13/22 63  11/21/21 75  11/06/20 74    Renal function: CrCl cannot be calculated (Patient's most recent lab result is older than the maximum 21 days allowed.).  Past Medical History:  Diagnosis Date   Allergic rhinitis    Anxiety    Depression    pt denies   GERD (gastroesophageal reflux disease)    History of renal stone    Hyperlipidemia    Hypertension    Obesity    Status  post dilation of esophageal narrowing     Current Outpatient Medications on File Prior to Visit  Medication Sig Dispense Refill   acetaminophen (TYLENOL) 500 MG tablet Take 1,000 mg by mouth every 8 (eight) hours as needed (pain).      aspirin 81 MG tablet Take 81 mg by mouth daily.     Cholecalciferol (VITAMIN D3) 50 MCG (2000 UT) capsule Take 2,000 Units by mouth daily.     Coenzyme Q-10 100 MG capsule Take 100 mg by mouth daily.     diclofenac Sodium (VOLTAREN) 1 % GEL SMARTSIG:2.5 Gram(s) Topical 4 Times Daily     furosemide (LASIX) 20 MG tablet Take 1 tablet (20 mg total) by mouth every other day. 20 tablet 3   furosemide (LASIX) 20 MG tablet Take 1 tablet by mouth daily for one week, then decrease to every other day thereafter 30 tablet 0   lisinopril (ZESTRIL) 20 MG tablet Take 1 tablet  (20 mg total) by mouth 2 (two) times daily. 90 tablet 3   MAGNESIUM-OXIDE 400 (241.3 Mg) MG tablet Take 1 tablet by mouth daily.     metoprolol succinate (TOPROL-XL) 50 MG 24 hr tablet Take 1 tablet (50 mg total) by mouth 2 (two) times daily. Take with or immediately following a meal. 180 tablet 3   simvastatin (ZOCOR) 40 MG tablet Take 40 mg by mouth at bedtime.     No current facility-administered medications on file prior to visit.    Allergies  Allergen Reactions   Prednisone Shortness Of Breath   Penicillins Rash    Did it involve swelling of the face/tongue/throat, SOB, or low BP? No Did it involve sudden or severe rash/hives, skin peeling, or any reaction on the inside of your mouth or nose? No Did you need to seek medical attention at a hospital or doctor's office? Yes When did it last happen?    2016   If all above answers are "NO", may proceed with cephalosporin use.    Sulfa Antibiotics Nausea Only   Sulfonamide Derivatives Nausea Only   Sulfamethoxazole Other (See Comments)    unknown   Tramadol Nausea And Vomiting     Assessment/Plan:  1. Hypertension -

## 2022-03-17 ENCOUNTER — Ambulatory Visit: Payer: Medicare Other | Attending: Interventional Cardiology | Admitting: Pharmacist

## 2022-03-17 VITALS — BP 155/82 | HR 69

## 2022-03-17 DIAGNOSIS — I10 Essential (primary) hypertension: Secondary | ICD-10-CM | POA: Diagnosis not present

## 2022-03-17 MED ORDER — AMLODIPINE BESYLATE 5 MG PO TABS: 5.0000 mg | ORAL_TABLET | Freq: Every day | ORAL | 3 refills | Status: DC

## 2022-03-17 MED ORDER — AMLODIPINE BESYLATE 2.5 MG PO TABS: 2.5000 mg | ORAL_TABLET | Freq: Every day | ORAL | 3 refills | Status: DC

## 2022-03-17 NOTE — Patient Instructions (Addendum)
Increase lisinopril to 20 mg twice daily  Continue metoprolol 25 mg twice daily Continue furosemide 20 mg every other day

## 2022-04-04 ENCOUNTER — Ambulatory Visit: Payer: Medicare Other

## 2022-04-08 ENCOUNTER — Telehealth: Payer: Self-pay

## 2022-04-08 ENCOUNTER — Telehealth: Payer: Self-pay | Admitting: Cardiovascular Disease

## 2022-04-08 ENCOUNTER — Ambulatory Visit: Payer: Medicare Other

## 2022-04-08 ENCOUNTER — Other Ambulatory Visit: Payer: Medicare Other

## 2022-04-08 DIAGNOSIS — I5031 Acute diastolic (congestive) heart failure: Secondary | ICD-10-CM

## 2022-04-08 DIAGNOSIS — I071 Rheumatic tricuspid insufficiency: Secondary | ICD-10-CM

## 2022-04-08 NOTE — Telephone Encounter (Signed)
Edited BMP order to be drawn in the future and lab collect also released order.

## 2022-04-08 NOTE — Telephone Encounter (Signed)
See previous encounter

## 2022-04-08 NOTE — Telephone Encounter (Signed)
Labs reentered to be drawn by [facility].

## 2022-04-08 NOTE — Addendum Note (Signed)
Addended by: Precious Gilding on: 04/08/2022 03:33 PM   Modules accepted: Orders

## 2022-04-08 NOTE — Telephone Encounter (Signed)
Pt calling about lab orders not released. Called triage and they put the lab order in again and faxed it over however [facility] is saying they still have not received. Pt states she will leave and go back tomorrow.   Fax number: [phone]

## 2022-04-09 ENCOUNTER — Telehealth (HOSPITAL_COMMUNITY): Payer: Self-pay | Admitting: Pharmacist

## 2022-04-09 DIAGNOSIS — I10 Essential (primary) hypertension: Secondary | ICD-10-CM

## 2022-04-09 LAB — BASIC METABOLIC PANEL
BUN/Creatinine Ratio: 24 (ref 12–28)
BUN: 24 mg/dL (ref 8–27)
CO2: 26 mmol/L (ref 20–29)
Calcium: 9.9 mg/dL (ref 8.7–10.3)
Chloride: 102 mmol/L (ref 96–106)
Creatinine, Ser: 0.99 mg/dL (ref 0.57–1.00)
Glucose: 83 mg/dL (ref 70–99)
Potassium: 5.3 mmol/L — ABNORMAL HIGH (ref 3.5–5.2)
Sodium: 142 mmol/L (ref 134–144)
eGFR: 56 mL/min/{1.73_m2} — ABNORMAL LOW (ref >59)

## 2022-04-09 NOTE — Telephone Encounter (Signed)
Spoke with patient regarding lab results. Potassium has increased to 5.3 with stable renal function after taking lisinopril 20 gm twice daily. Pt is traveling at the time of this call. Will call back later this week for BP readings to made medication changes. For now, patient was instructed to decrease lisinopril to 20 mg once daily.

## 2022-04-11 MED ORDER — FUROSEMIDE 20 MG PO TABS: 20.0000 mg | ORAL_TABLET | Freq: Every day | ORAL | 3 refills | Status: DC | PRN

## 2022-04-11 MED ORDER — AMLODIPINE BESYLATE 2.5 MG PO TABS: 2.5000 mg | ORAL_TABLET | Freq: Two times a day (BID) | ORAL | 3 refills | Status: DC

## 2022-04-11 MED ORDER — HYDROCHLOROTHIAZIDE 12.5 MG PO CAPS: 12.5000 mg | ORAL_CAPSULE | Freq: Every day | ORAL | 3 refills | Status: DC

## 2022-04-11 MED ORDER — LISINOPRIL 20 MG PO TABS: 20.0000 mg | ORAL_TABLET | Freq: Every day | ORAL | 3 refills | Status: DC

## 2022-04-11 NOTE — Telephone Encounter (Addendum)
Spoke to patient regarding BP readings at home. Readings are listed below. The readings were taken once daily with the last reading on 10/13. The patient reports never decreasing lisinopril to once daily. She was instructed to do so and explained the importance of doing so to avoid hyperkalemia. Given BP readings were still elevated and lisinopril will be dose reduced, HCTZ 12.5 mg daily was ordered. Patient requested to start at low dose. Pt will receive labs in [place], then follow up the next week in clinic on 11/27. Confirmed patient is already taking amlodipine 2.5 mg twice daily.  Home BP readings:  136/66 137/79 150/75 140/78 131/73 129/72 129/66 118/65 123/66 136/66   Thank you for allowing pharmacy to participate in this patient's care.  Reatha Harps, PharmD PGY2 Pharmacy Resident

## 2022-04-11 NOTE — Addendum Note (Signed)
Addended by: Ursula Beath on: 04/11/2022 12:04 PM   Modules accepted: Orders

## 2022-04-14 NOTE — Addendum Note (Signed)
Addended by: Marcelle Overlie D on: 04/14/2022 01:12 PM   Modules accepted: Orders

## 2022-04-15 ENCOUNTER — Telehealth: Payer: Self-pay

## 2022-04-15 NOTE — Addendum Note (Signed)
Addended by: Marcelle Overlie D on: 04/15/2022 05:05 PM   Modules accepted: Orders

## 2022-04-15 NOTE — Telephone Encounter (Signed)
Reached out to patient regarding recent lab result showing K at 5.3 and need to repeat in 1 month, as well as reviewed potassium-rich foods. Patient states that every food we mentioned, she eats on a daily basis because they are healthy. Advised that she pick a couple of these foods and cut down for about 2 weeks on her consumption, then do blood work again and we can go from there. In essence, if her diet/intake hasn't changed, it may be pointing to something else. Pt questions her medication regimen in relation to her BP meds and recent recommendation by PharmD team to (what she reports) start Furosemide daily and HCTZ daily in addition to decreasing her Lisinopril and states "I don't understand why I'm changing things when it's actually working." She is also very hesitant to start a diuretic. Advised that this regimen should prevent future rises in potassium level (hyperkalemia). She is requesting a call back with more education on why these changes are to be made and what specifically she should be doing. Will route to PharmD to request that they return call to patient to review recommendations (again).

## 2022-04-15 NOTE — Telephone Encounter (Signed)
Spoke with patient. She wants to stay on her current regimen- lisinopril 40mg  daily, furosemide 20mg  every other day, amlodipine 2.5mg  daily and metoprolol 50mg  daily. I did explain that lisinopril can increase her K and that is why we recommended the decrease. I explained that HCTZ is to help the BP since we needed to decrease the lisinopril but patient wants to try decreasing her K foods and recheck in 2 weeks. She wants Dr. Burt Knack to review those labs.  She will get labs in 2 weeks in [place].  She is agreeable to change if K is still high in 2 weeks.

## 2022-04-16 NOTE — Telephone Encounter (Signed)
Agree with those recommendations and plan for a repeat metabolic panel in 2 weeks.  Thanks

## 2022-04-16 NOTE — Telephone Encounter (Signed)
Left message with patient informing her that Dr Burt Knack supports PharmD recommendations and will review labs in 2 weeks to evaluate K level and need for medication adjustments at that time.

## 2022-05-05 NOTE — Telephone Encounter (Signed)
Marcelle Overlie D, RPH-CPP  You2 hours ago (1:08 PM)   Looks like she didn't get her labs done yet. Do you mind calling and reminding her?   Left message for patient asking that she call us back regarding getting necessary labs done.

## 2022-05-14 NOTE — Telephone Encounter (Signed)
Returned call again to patient who states that she is going to [facility] in [place] this week and will get it done. She is going to email [place] to let us know when it's been completed.

## 2022-08-23 ENCOUNTER — Other Ambulatory Visit: Payer: Self-pay | Admitting: Cardiovascular Disease

## 2022-09-09 ENCOUNTER — Other Ambulatory Visit: Payer: Self-pay | Admitting: Cardiovascular Disease

## 2022-11-18 ENCOUNTER — Ambulatory Visit: Payer: Medicare Other | Attending: Cardiovascular Disease | Admitting: Cardiovascular Disease

## 2022-11-18 ENCOUNTER — Encounter: Payer: Self-pay | Admitting: Cardiovascular Disease

## 2022-11-18 VITALS — BP 158/82 | HR 68 | Ht 63.0 in | Wt 147.0 lb

## 2022-11-18 DIAGNOSIS — I10 Essential (primary) hypertension: Secondary | ICD-10-CM | POA: Diagnosis not present

## 2022-11-18 DIAGNOSIS — I071 Rheumatic tricuspid insufficiency: Secondary | ICD-10-CM

## 2022-11-18 DIAGNOSIS — Z79899 Other long term (current) drug therapy: Secondary | ICD-10-CM

## 2022-11-18 DIAGNOSIS — I5032 Chronic diastolic (congestive) heart failure: Secondary | ICD-10-CM

## 2022-11-18 NOTE — Progress Notes (Signed)
Cardiology Office Note:    Date:  11/18/2022   ID:  Jessica Ware, DOB 1935-07-12, MRN [id number]  PCP:  Karle Plumber, MD   [name] Cardiologist:  Tonny Bollman, MD Electrophysiologist:  Lanier Prude, MD     Referring MD: Karle Plumber, MD   Chief Complaint  Patient presents with   Hypertension    History of Present Illness:    Jessica Ware is a 87 y.o. female presenting for follow-up evaluation today.  The patient has been followed for hypertension and chronic diastolic heart failure.  Last year her antihypertensive medications were adjusted to better control systolic hypertension.  Amlodipine was discontinued because of lower extremity edema.  However, her blood pressure increased and she started back on amlodipine currently taking it 2 times daily at a dose of 2.5 mg.  The patient is here alone today.  She has been doing fairly well.  She denies any chest pain, shortness of breath, or recurrent leg edema.  She has not been taking her blood pressure too often and states that it is normally in a good range when she checks it.  Past Medical History:  Diagnosis Date   Allergic rhinitis    Anxiety    Depression    pt denies   GERD (gastroesophageal reflux disease)    History of renal stone    Hyperlipidemia    Hypertension    Obesity    Status post dilation of esophageal narrowing     Past Surgical History:  Procedure Laterality Date   ABDOMINAL HYSTERECTOMY     KIDNEY STONE SURGERY     1985    Current Medications: Current Meds  Medication Sig   acetaminophen (TYLENOL) 500 MG tablet Take 1,000 mg by mouth every 8 (eight) hours as needed (pain).    amLODipine (NORVASC) 2.5 MG tablet Take 1 tablet (2.5 mg total) by mouth 2 (two) times daily.   aspirin 81 MG tablet Take 81 mg by mouth daily.   Cholecalciferol (VITAMIN D3) 50 MCG (2000 UT) capsule Take 2,000 Units by mouth daily.   Coenzyme Q-10 100 MG capsule Take  100 mg by mouth daily.   diclofenac Sodium (VOLTAREN) 1 % GEL SMARTSIG:2.5 Gram(s) Topical 4 Times Daily   furosemide (LASIX) 20 MG tablet Take 1 tablet (20 mg total) by mouth every other day.   lisinopril (ZESTRIL) 20 MG tablet Take 1 tablet (20 mg total) by mouth 2 (two) times daily.   MAGNESIUM-OXIDE 400 (241.3 Mg) MG tablet Take 1 tablet by mouth daily.   metoprolol succinate (TOPROL-XL) 50 MG 24 hr tablet Take 1 tablet (50 mg total) by mouth 2 (two) times daily. Take with or immediately following a meal.   simvastatin (ZOCOR) 40 MG tablet Take 40 mg by mouth at bedtime.     Allergies:   Prednisone, Penicillins, Sulfa antibiotics, Sulfonamide derivatives, Sulfamethoxazole, and Tramadol   Social History   Socioeconomic History   Marital status: Married    Spouse name: Not on file   Number of children: 3   Years of education: Not on file   Highest education level: Not on file  Occupational History   Occupation: retired    Associate Professor: HOMEMAKER  Tobacco Use   Smoking status: Never   Smokeless tobacco: Never  Substance and Sexual Activity   Alcohol use: No   Drug use: No   Sexual activity: Not on file  Other Topics Concern   Not on file  Social  History Narrative   HSG, 1 year of college   Married '61   2 sons- '62, '64, 1 dtr-'66; 4 grandchildren   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         Social Determinants of Health   Financial Resource Strain: Not on file  Food Insecurity: Not on file  Transportation Needs: Not on file  Physical Activity: Not on file  Stress: Not on file  Social Connections: Not on file     Family History: The patient's family history includes Heart disease in her father; Heart disease (age of onset: 62) in her mother; Hypertension in her mother; Kidney disease in her father; Prostate cancer in her father.  ROS:   Please see the history of present illness.    All other systems reviewed and  are negative.  EKGs/Labs/Other Studies Reviewed:    The following studies were reviewed today: Cardiac Studies & Procedures     STRESS TESTS  MYOCARDIAL PERFUSION IMAGING 07/28/2019  Narrative  There was no ST segment deviation noted during stress.  No T wave inversion was noted during stress.  Defect 1: There is a medium defect of mild severity.  Low risk stress nuclear study with a limited area of mild ischemia, corresponding to the distribution of a diagonal or ramus intermedius artery. Non-gated study.   ECHOCARDIOGRAM  ECHOCARDIOGRAM COMPLETE 01/17/2022  Narrative ECHOCARDIOGRAM REPORT    Patient Name:   Jessica Ware Shorty Date of Exam: 01/17/2022 Medical Rec #:  [id number]              Height:       63.0 in Accession #:    [id number]             Weight:       143.8 lb Date of Birth:  May 14, 1936              BSA:          1.681 m Patient Age:    85 years               BP:           140/80 mmHg Patient Gender: F                      HR:           50 bpm. Exam Location:  [address]  Procedure: 2D Echo, Cardiac Doppler and Color Doppler  Indications:    I50.32 Chronic diastolic (congestive) heart failure  History:        Patient has prior history of Echocardiogram examinations, most recent 08/14/2020. Arrythmias:PVC, Signs/Symptoms:Fatigue; Risk Factors:Hypertension and Dyslipidemia.  Sonographer:    Cathie Beams RCS Referring Phys: [phone] Osias Resnick  IMPRESSIONS   1. Left ventricular ejection fraction, by estimation, is 55 to 60%. The left ventricle has normal function. The left ventricle has no regional wall motion abnormalities. There is mild concentric left ventricular hypertrophy. Left ventricular diastolic parameters are consistent with Grade I diastolic dysfunction (impaired relaxation). 2. Right ventricular systolic function is normal. The right ventricular size is normal. 3. The mitral valve is normal in structure. Trivial mitral valve  regurgitation. 4. Tricuspid valve regurgitation is moderate. IVC not well visualized for the evaluation of hepatic vein flow reversal or for RVSP assessment. 5. The aortic valve is tricuspid. Aortic valve regurgitation is mild. No aortic stenosis is present. 6. Frequent ectopy  during study.  Comparison(s): Similar ectopy from prior, tricuspid regurgitation has increased.  FINDINGS Left Ventricle: Left ventricular ejection fraction, by estimation, is 55 to 60%. The left ventricle has normal function. The left ventricle has no regional wall motion abnormalities. The left ventricular internal cavity size was small. There is mild concentric left ventricular hypertrophy. Left ventricular diastolic parameters are consistent with Grade I diastolic dysfunction (impaired relaxation).  Right Ventricle: The right ventricular size is normal. No increase in right ventricular wall thickness. Right ventricular systolic function is normal.  Left Atrium: Left atrial size was normal in size.  Right Atrium: Right atrial size was normal in size.  Pericardium: There is no evidence of pericardial effusion.  Mitral Valve: The mitral valve is normal in structure. Trivial mitral valve regurgitation.  Tricuspid Valve: The tricuspid valve is grossly normal. Tricuspid valve regurgitation is moderate.  Aortic Valve: The aortic valve is tricuspid. Aortic valve regurgitation is mild. No aortic stenosis is present.  Pulmonic Valve: The pulmonic valve was normal in structure. Pulmonic valve regurgitation is mild. No evidence of pulmonic stenosis.  Aorta: The aortic root and ascending aorta are structurally normal, with no evidence of dilitation.  Pulmonary Artery: Two jets.  Venous: The inferior vena cava was not well visualized.  IAS/Shunts: No atrial level shunt detected by color flow Doppler.   LEFT VENTRICLE PLAX 2D LVIDd:         3.40 cm   Diastology LVIDs:         2.60 cm   LV e' medial:    5.87 cm/s LV  PW:         1.20 cm   LV E/e' medial:  14.5 LV IVS:        1.20 cm   LV e' lateral:   7.18 cm/s LVOT diam:     2.00 cm   LV E/e' lateral: 11.9 LV SV:         56 LV SV Index:   33 LVOT Area:     3.14 cm   RIGHT VENTRICLE RV Basal diam:  2.40 cm RV S prime:     10.10 cm/s TAPSE (M-mode): 1.6 cm RVSP:           32.6 mmHg  LEFT ATRIUM             Index        RIGHT ATRIUM           Index LA diam:        2.90 cm 1.73 cm/m   RA Pressure: 3.00 mmHg LA Vol (A2C):   32.4 ml 19.28 ml/m  RA Area:     9.22 cm LA Vol (A4C):   40.0 ml 23.80 ml/m  RA Volume:   15.40 ml  9.16 ml/m LA Biplane Vol: 37.4 ml 22.25 ml/m AORTIC VALVE             PULMONIC VALVE LVOT Vmax:   72.50 cm/s  PR End Diast Vel: 3.33 msec LVOT Vmean:  49.000 cm/s LVOT VTI:    0.178 m  AORTA Ao Root diam: 3.40 cm Ao Asc diam:  3.50 cm  MITRAL VALVE                TRICUSPID VALVE MV Area (PHT): 2.76 cm     TR Peak grad:   29.6 mmHg MV Decel Time: 275 msec     TR Vmax:        272.00 cm/s MV E velocity: 85.40 cm/s  Estimated RAP:  3.00 mmHg MV A velocity: 110.00 cm/s  RVSP:           32.6 mmHg MV E/A ratio:  0.78 SHUNTS Systemic VTI:  0.18 m Systemic Diam: 2.00 cm  Riley Lam MD Electronically signed by Riley Lam MD Signature Date/Time: 01/17/2022/4:21:43 PM    Final    MONITORS  LONG TERM MONITOR (3-14 DAYS) 07/20/2020  Narrative Patch Wear Time:  2 days and 22 hours (2021-12-27T21:44:38-0500 to 2021-12-30T20:35:18-0500)  Patient had a min HR of 76 bpm, max HR of 157 bpm, and avg HR of 92 bpm. Predominant underlying rhythm was Sinus Rhythm. Isolated SVEs were rare (<1.0%), SVE Couplets were rare (<1.0%), and no SVE Triplets were present. Isolated VEs were frequent (28.9%, 110740), VE Couplets were rare (<1.0%, 315), and VE Triplets were rare (<1.0%, 103). Ventricular Bigeminy and Trigeminy were present.  Study reviewed. Average HR 92 bpm. No sustained arrhythmia. No Afib or flutter.  Frequent monomorphic PVC's (29% burden).            EKG:  EKG is ordered today.  The ekg ordered today demonstrates NSR 68 bpm, voltage criteria for LVH, otherwise normal  Recent Labs: 11/21/2021: Magnesium 2.2 04/08/2022: BUN 24; Creatinine, Ser 0.99; Potassium 5.3; Sodium 142  Recent Lipid Panel    Component Value Date/Time   CHOL 181 11/06/2020 1022   TRIG 99 11/06/2020 1022   HDL 67 11/06/2020 1022   CHOLHDL 2.7 11/06/2020 1022   CHOLHDL 3.8 02/26/2016 1332   VLDL 19 02/26/2016 1332   LDLCALC 96 11/06/2020 1022   LDLDIRECT 112.1 07/03/2011 1046     Risk Assessment/Calculations:      HYPERTENSION CONTROL Vitals:   11/18/22 1439 11/18/22 1515  BP: (!) 156/83 (!) 158/82    The patient's blood pressure is elevated above target today.  In order to address the patient's elevated BP: Blood pressure will be monitored at home to determine if medication changes need to be made.            Physical Exam:    VS:  BP (!) 158/82   Pulse 68   Ht 5\' 3"  (1.6 m)   Wt 147 lb (66.7 kg)   BMI 26.04 kg/m     Wt Readings from Last 3 Encounters:  11/18/22 147 lb (66.7 kg)  01/13/22 143 lb 12.8 oz (65.2 kg)  11/21/21 150 lb 9.6 oz (68.3 kg)     GEN:  Well nourished, well developed in no acute distress HEENT: Normal NECK: No JVD; No carotid bruits LYMPHATICS: No lymphadenopathy CARDIAC: RRR, no murmurs, rubs, gallops RESPIRATORY:  Clear to auscultation without rales, wheezing or rhonchi  ABDOMEN: Soft, non-tender, non-distended MUSCULOSKELETAL:  No edema; No deformity  SKIN: Warm and dry NEUROLOGIC:  Alert and oriented x 3 PSYCHIATRIC:  Normal affect   ASSESSMENT:    1. Essential hypertension   2. Chronic diastolic heart failure (HCC)   3. Medication management   4. Tricuspid valve insufficiency, unspecified etiology    PLAN:    In order of problems listed above:  I repeated the patient's blood pressure and obtained a reading of 158/82.  She states this is  unusual for her.  She currently is on amlodipine 2.5 mg twice daily, lisinopril 20 mg daily, and metoprolol succinate 50 mg twice daily.  Her lisinopril was reduced because of hyperkalemia.  Will check her labs today.  I asked her to monitor blood pressure 3 days/week and call in readings in a few weeks  so we can make further adjustments if needed. Appears euvolemic.  No active symptoms of heart failure.  Continue current management.  Check labs today.  Follow-up 6 months. Check labs and home blood pressures as above before changing any medications I personally reviewed her echo images from last July which demonstrated moderate tricuspid regurgitation.  Continue clinical follow-up.  Consider repeat echo next year.      Medication Adjustments/Labs and Tests Ordered: Current medicines are reviewed at length with the patient today.  Concerns regarding medicines are outlined above.  Orders Placed This Encounter  Procedures   Comp Met (CMET)   Magnesium   Vitamin D (25 hydroxy)   EKG 12-Lead   No orders of the defined types were placed in this encounter.   Patient Instructions  Medication Instructions:  Your physician recommends that you continue on your current medications as directed. Please refer to the Current Medication list given to you today.  *If you need a refill on your cardiac medications before your next appointment, please call your pharmacy*   Lab Work: Lab work to be done today==CMET, Magnesium, Vitamin D If you have labs (blood work) drawn today and your tests are completely normal, you will receive your results only by: MyChart Message (if you have MyChart) OR A paper copy in the mail If you have any lab test that is abnormal or we need to change your treatment, we will call you to review the results.   Testing/Procedures: none   Follow-Up: At [facility], you and your health needs are our priority.  As part of our continuing mission to provide you with  exceptional heart care, we have created designated Provider Care Teams.  These Care Teams include your primary Cardiologist (physician) and Advanced Practice Providers (APPs -  Physician Assistants and Nurse Practitioners) who all work together to provide you with the care you need, when you need it.  We recommend signing up for the patient portal called "MyChart".  Sign up information is provided on this After Visit Summary.  MyChart is used to connect with patients for Virtual Visits (Telemedicine).  Patients are able to view lab/test results, encounter notes, upcoming appointments, etc.  Non-urgent messages can be sent to your provider as well.   To learn more about what you can do with MyChart, go to [email or web address].    Your next appointment:   June 05, 2023 at 3:00  Provider:   Tonny Bollman, MD     Other Instructions Check blood pressure 3 days per week and keep record of readings.  Call readings in or send through my chart in a couple of weeks.     Signed, Tonny Bollman, MD  11/18/2022 5:41 PM    [facility]

## 2022-11-18 NOTE — Patient Instructions (Addendum)
Medication Instructions:  Your physician recommends that you continue on your current medications as directed. Please refer to the Current Medication list given to you today.  *If you need a refill on your cardiac medications before your next appointment, please call your pharmacy*   Lab Work: Lab work to be done today==CMET, Magnesium, Vitamin D If you have labs (blood work) drawn today and your tests are completely normal, you will receive your results only by: MyChart Message (if you have MyChart) OR A paper copy in the mail If you have any lab test that is abnormal or we need to change your treatment, we will call you to review the results.   Testing/Procedures: none   Follow-Up: At [facility], you and your health needs are our priority.  As part of our continuing mission to provide you with exceptional heart care, we have created designated Provider Care Teams.  These Care Teams include your primary Cardiologist (physician) and Advanced Practice Providers (APPs -  Physician Assistants and Nurse Practitioners) who all work together to provide you with the care you need, when you need it.  We recommend signing up for the patient portal called "MyChart".  Sign up information is provided on this After Visit Summary.  MyChart is used to connect with patients for Virtual Visits (Telemedicine).  Patients are able to view lab/test results, encounter notes, upcoming appointments, etc.  Non-urgent messages can be sent to your provider as well.   To learn more about what you can do with MyChart, go to [email or web address].    Your next appointment:   June 05, 2023 at 3:00  Provider:   Tonny Bollman, MD     Other Instructions Check blood pressure 3 days per week and keep record of readings.  Call readings in or send through my chart in a couple of weeks.

## 2022-11-19 LAB — MAGNESIUM: Magnesium: 2.1 mg/dL (ref 1.6–2.3)

## 2022-11-19 LAB — COMPREHENSIVE METABOLIC PANEL
ALT: 17 IU/L (ref 0–32)
AST: 20 IU/L (ref 0–40)
Albumin/Globulin Ratio: 1.6 (ref 1.2–2.2)
Albumin: 4 g/dL (ref 3.7–4.7)
Alkaline Phosphatase: 128 IU/L — ABNORMAL HIGH (ref 44–121)
BUN/Creatinine Ratio: 32 — ABNORMAL HIGH (ref 12–28)
BUN: 26 mg/dL (ref 8–27)
Bilirubin Total: 1.3 mg/dL — ABNORMAL HIGH (ref 0.0–1.2)
CO2: 23 mmol/L (ref 20–29)
Calcium: 9.7 mg/dL (ref 8.7–10.3)
Chloride: 104 mmol/L (ref 96–106)
Creatinine, Ser: 0.82 mg/dL (ref 0.57–1.00)
Globulin, Total: 2.5 g/dL (ref 1.5–4.5)
Glucose: 86 mg/dL (ref 70–99)
Potassium: 4.2 mmol/L (ref 3.5–5.2)
Sodium: 140 mmol/L (ref 134–144)
Total Protein: 6.5 g/dL (ref 6.0–8.5)
eGFR: 70 mL/min/{1.73_m2} (ref >59)

## 2022-11-19 LAB — VITAMIN D 25 HYDROXY (VIT D DEFICIENCY, FRACTURES): Vit D, 25-Hydroxy: 38.6 ng/mL (ref 30.0–100.0)

## 2022-11-28 ENCOUNTER — Other Ambulatory Visit: Payer: Self-pay | Admitting: Cardiovascular Disease

## 2022-12-16 ENCOUNTER — Telehealth: Payer: Self-pay | Admitting: Cardiovascular Disease

## 2022-12-16 NOTE — Telephone Encounter (Signed)
Patient was returning call. Please advise ?

## 2022-12-16 NOTE — Telephone Encounter (Signed)
Per OV note by Excell Seltzer on 11/18/22:  I repeated the patient's blood pressure and obtained a reading of 158/82. She states this is unusual for her. She currently is on amlodipine 2.5 mg twice daily, lisinopril 20 mg daily, and metoprolol succinate 50 mg twice daily. Her lisinopril was reduced because of hyperkalemia. Will check her labs today. I asked her to monitor blood pressure 3 days/week and call in readings in a few weeks so we can make further adjustments if needed.   Called patient to obtain BP log as nothing currently has been received. Left message at both numbers on file asking patient to call back

## 2022-12-16 NOTE — Telephone Encounter (Signed)
Patient has only been taking her readings once a day and she takes them at night only. I did advise to start checking 3 times a day and monitor heart rate as well.  125/69  132/73 134/69 121/68 112/63 140/77 113/69 108/59 126/72

## 2022-12-16 NOTE — Telephone Encounter (Signed)
Pt c/o medication issue:  1. Name of Medication:   amLODipine (NORVASC) 2.5 MG tablet    2. How are you currently taking this medication (dosage and times per day)? 1 tablet twice daily   3. Are you having a reaction (difficulty breathing--STAT)? No   4. What is your medication issue? Pharmacy called in to clarify pt's instructions on this med. They only have pt taking this med once daily however pt reports she has been taking it 2x daily. He stated they would need this clarified and another rx sent over.

## 2022-12-18 ENCOUNTER — Telehealth: Payer: Self-pay | Admitting: Cardiovascular Disease

## 2022-12-18 MED ORDER — AMLODIPINE BESYLATE 2.5 MG PO TABS: 2.5000 mg | ORAL_TABLET | Freq: Two times a day (BID) | ORAL | 3 refills | Status: DC

## 2022-12-18 NOTE — Telephone Encounter (Signed)
*  STAT* If patient is at the pharmacy, call can be transferred to refill team.   1. Which medications need to be refilled? (please list name of each medication and dose if known)   amLODipine (NORVASC) 2.5 MG tablet   2. Which pharmacy/location (including street and city if local pharmacy) is medication to be sent to?  [facility] - [place], [place] - [address]   3. Do they need a 30 day or 90 day supply?   90 day  Caller stated patient is completely out of this medication.

## 2022-12-18 NOTE — Telephone Encounter (Signed)
Pt's medication was sent to pt's pharmacy as requested. Confirmation received.  °

## 2022-12-19 NOTE — Telephone Encounter (Signed)
Medication already sent to pharmacy. Jessica Bollman, MD  Quintella Reichert (1:20 PM)    Yes that sounds like a plan! thx   Theodoro Parma, Casimiro Needle, MD2 days ago    Looks like Amlodipine 2.5 BID is working for her (only one high reading)- - -okay with keeping on this and me sending refill?

## 2022-12-26 ENCOUNTER — Other Ambulatory Visit: Payer: Self-pay | Admitting: Cardiovascular Disease

## 2023-01-12 ENCOUNTER — Ambulatory Visit (HOSPITAL_COMMUNITY): Payer: Medicare Other

## 2023-01-12 ENCOUNTER — Telehealth: Payer: Self-pay | Admitting: Cardiovascular Disease

## 2023-01-12 NOTE — Telephone Encounter (Signed)
Pt needed to reschedule today's appointment because of husbands bronchitis. Pt needs the order resent for a later date. Please advise.

## 2023-01-13 ENCOUNTER — Other Ambulatory Visit (HOSPITAL_COMMUNITY): Payer: Medicare Other

## 2023-01-14 NOTE — Telephone Encounter (Signed)
Will send message to echo scheduler to see if patient can get rescheduled for the echocardiogram she missed on 01/12/23.

## 2023-01-16 ENCOUNTER — Other Ambulatory Visit (HOSPITAL_COMMUNITY): Payer: Medicare Other

## 2023-02-06 ENCOUNTER — Ambulatory Visit (HOSPITAL_COMMUNITY): Payer: Medicare Other

## 2023-02-10 ENCOUNTER — Ambulatory Visit (HOSPITAL_COMMUNITY): Payer: Medicare Other

## 2023-02-12 ENCOUNTER — Ambulatory Visit (HOSPITAL_COMMUNITY): Payer: Medicare Other | Attending: Cardiovascular Disease

## 2023-02-12 DIAGNOSIS — I071 Rheumatic tricuspid insufficiency: Secondary | ICD-10-CM | POA: Insufficient documentation

## 2023-02-12 LAB — ECHOCARDIOGRAM COMPLETE
Area-P 1/2: 3.91 cm2
P 1/2 time: 544 msec
S' Lateral: 2.8 cm

## 2023-03-06 ENCOUNTER — Other Ambulatory Visit: Payer: Self-pay | Admitting: Cardiovascular Disease

## 2023-03-17 ENCOUNTER — Ambulatory Visit: Payer: Medicare Other | Admitting: Cardiovascular Disease

## 2023-03-20 ENCOUNTER — Other Ambulatory Visit: Payer: Self-pay | Admitting: Cardiovascular Disease

## 2023-03-20 DIAGNOSIS — I10 Essential (primary) hypertension: Secondary | ICD-10-CM

## 2023-03-24 ENCOUNTER — Encounter: Payer: Self-pay | Admitting: Cardiovascular Disease

## 2023-04-21 ENCOUNTER — Telehealth: Payer: Self-pay | Admitting: Cardiovascular Disease

## 2023-04-21 NOTE — Telephone Encounter (Signed)
Pt c/o swelling/edema: STAT if pt has developed SOB within 24 hours  If swelling, where is the swelling located?   Right foot  How much weight have you gained and in what time span?   No  Have you gained 2 pounds in a day or 5 pounds in a week?   No  Do you have a log of your daily weights (if so, list)?   No  Are you currently taking a fluid pill?   Yes - every other day  Are you currently SOB?  No  Have you traveled recently in a car or plane for an extended period of time?  No  Patient stated she has been off amLODipine (NORVASC) 2.5 MG tablet as she had broken her wrist and her BP was as high as 189 and has since come down.  Patient noted her BP recent readings were 135/84, 147/79, 144/87, 156/90, 151/87.  Patient wants advice regarding re-starting amlodipine and next steps.

## 2023-04-22 NOTE — Telephone Encounter (Signed)
Patient is calling back for update. Please advise  

## 2023-04-27 NOTE — Telephone Encounter (Signed)
Per AVS from recent stay at [facility] ED after a fall, she was still supposed to be taking Amlodipine 2.5mg  daily. Returned call to patient who states she is currently running between 130-140 systolic and "about 90 on bottom." States she has restarted her Amlodipine 2.5mg  daily for the last week. Advised her to continue on the Amlodipine and check BP readings over the next 7-10 days and let us know if trending greater than 140/59mmhg. She has had several stressful events lately, so hopefully BP returns to baseline now.

## 2023-04-30 ENCOUNTER — Telehealth: Payer: Self-pay | Admitting: Cardiovascular Disease

## 2023-04-30 NOTE — Telephone Encounter (Signed)
Spoke with the patient who states that she has restarted her amlodipine 2.5 mg twice daily, however her blood pressure has still been elevated. Last night her BP was 173/93. This morning BP was 152/84. She has not taken any of her medicine this morning. I advised her to go ahead and take her morning medications and recheck her blood pressure around lunch time. She will let us know if blood pressure remains elevated still.   Patient does note that she has had a lot of pain and stressful events in her life recently. This started back in August when she broke her wrist. There was one point that her blood pressure was >200 when she was having a procedure done on her wrist but it has not been that high since.   She does have a follow up scheduled with Dr. Excell Seltzer on 11/7. Advised to keep up with a blood pressure log to be reviewed at that appointment.

## 2023-04-30 NOTE — Telephone Encounter (Signed)
Pt c/o BP issue: STAT if pt c/o blurred vision, one-sided weakness or slurred speech  1. What are your last 5 BP readings? BP has been in 200's  2. Are you having any other symptoms (ex. Dizziness, headache, blurred vision, passed out)? No   3. What is your BP issue? Patient has been experiencing really high BP

## 2023-04-30 NOTE — Telephone Encounter (Signed)
Agree. I will review her BP readings next week at the time of her appt. thx

## 2023-05-06 ENCOUNTER — Telehealth: Payer: Self-pay | Admitting: Cardiovascular Disease

## 2023-05-06 NOTE — Telephone Encounter (Signed)
Pt c/o medication issue:  1. Name of Medication: lisinopril (ZESTRIL) 20 MG tablet   2. How are you currently taking this medication (dosage and times per day)? 1 tab BID  3. Are you having a reaction (difficulty breathing--STAT)? NO  4. What is your medication issue? Pharm needs clarification on if pt is to be taking bid (per pt) or every day per rx sent to [facility] [place], [place] - [address] [place] [address] Phone: [phone]  Fax: [phone]

## 2023-05-06 NOTE — Telephone Encounter (Signed)
Please advise on correct dose of Lisinopril. Last note from 5/21 stated continue current dose of Lisinopril 20 mg daily. Pharmacy is asking for clarification. Spoke to patient and she stated she was told to take it twice daily.

## 2023-05-07 ENCOUNTER — Ambulatory Visit: Payer: Medicare Other | Attending: Cardiovascular Disease | Admitting: Cardiovascular Disease

## 2023-05-07 ENCOUNTER — Encounter: Payer: Self-pay | Admitting: Cardiovascular Disease

## 2023-05-07 VITALS — BP 120/60 | HR 74 | Ht 63.0 in | Wt 151.0 lb

## 2023-05-07 DIAGNOSIS — I5032 Chronic diastolic (congestive) heart failure: Secondary | ICD-10-CM | POA: Diagnosis not present

## 2023-05-07 DIAGNOSIS — I071 Rheumatic tricuspid insufficiency: Secondary | ICD-10-CM

## 2023-05-07 DIAGNOSIS — Z79899 Other long term (current) drug therapy: Secondary | ICD-10-CM | POA: Diagnosis not present

## 2023-05-07 DIAGNOSIS — I10 Essential (primary) hypertension: Secondary | ICD-10-CM

## 2023-05-07 MED ORDER — LISINOPRIL 20 MG PO TABS: 20.0000 mg | ORAL_TABLET | Freq: Two times a day (BID) | ORAL | 3 refills | Status: DC

## 2023-05-07 NOTE — Patient Instructions (Signed)
Medication Instructions:  Your physician recommends that you continue on your current medications as directed. Please refer to the Current Medication list given to you today.  *If you need a refill on your cardiac medications before your next appointment, please call your pharmacy*   Lab Work: TODAY:  CMET, LIPID, & MAG  If you have labs (blood work) drawn today and your tests are completely normal, you will receive your results only by: MyChart Message (if you have MyChart) OR A paper copy in the mail If you have any lab test that is abnormal or we need to change your treatment, we will call you to review the results.   Testing/Procedures: None ordered   Follow-Up: At [facility], you and your health needs are our priority.  As part of our continuing mission to provide you with exceptional heart care, we have created designated Provider Care Teams.  These Care Teams include your primary Cardiologist (physician) and Advanced Practice Providers (APPs -  Physician Assistants and Nurse Practitioners) who all work together to provide you with the care you need, when you need it.  We recommend signing up for the patient portal called "MyChart".  Sign up information is provided on this After Visit Summary.  MyChart is used to connect with patients for Virtual Visits (Telemedicine).  Patients are able to view lab/test results, encounter notes, upcoming appointments, etc.  Non-urgent messages can be sent to your provider as well.   To learn more about what you can do with MyChart, go to [email or web address].    Your next appointment:   6 month(s)  Provider:   Tonny Bollman, MD     Other Instructions

## 2023-05-07 NOTE — Assessment & Plan Note (Signed)
Blood pressure now well-controlled on a combination of lisinopril, low-dose amlodipine, and metoprolol succinate.  I will update her labs with a metabolic panel today.  She will continue her current medical program.

## 2023-05-07 NOTE — Telephone Encounter (Signed)
I would continue as she is taking (BID). thanks

## 2023-05-07 NOTE — Telephone Encounter (Signed)
Clarifications given to pharmacy new prescription sent to pharmacy.

## 2023-05-07 NOTE — Progress Notes (Signed)
Cardiology Office Note:    Date:  05/07/2023   ID:  Jessica Ware, DOB [birth year], MRN [id number]  PCP:  Karle Plumber, MD   [facility] Providers Cardiologist:  Tonny Bollman, MD Electrophysiologist:  Lanier Prude, MD     Referring MD: Karle Plumber, MD   Chief Complaint  Patient presents with   Hypertension    History of Present Illness:    Jessica Ware is a 87 y.o. female presenting for follow-up of hypertension and chronic HFpEF.  She was last seen in May of this year for routine follow-up.  She was noted to be doing well at that time, but recently has had more problems with blood pressure control.  She had a period of time where she underwent a lot of stress with a traumatic left wrist fracture, right knee pain and swelling, and cataract surgery.  Her blood pressure spiked during this time and was as high as 200/100 mmHg.  Over the past week her blood pressure is really settled back down and has been well-controlled.  She had a blood pressure of 120/70 this morning before coming to the office.  She continues on metoprolol succinate, low-dose amlodipine, and lisinopril, all dosed twice daily.  She has had no chest pain or pressure.  She denies shortness of breath.  She has had no ankle swelling, orthopnea, or PND.   Current Medications: Current Meds  Medication Sig   acetaminophen (TYLENOL) 500 MG tablet Take 1,000 mg by mouth every 8 (eight) hours as needed (pain).    amLODipine (NORVASC) 2.5 MG tablet Take 1 tablet (2.5 mg total) by mouth 2 (two) times daily.   aspirin 81 MG tablet Take 81 mg by mouth daily.   Cholecalciferol (VITAMIN D3) 50 MCG (2000 UT) capsule Take 2,000 Units by mouth daily.   Coenzyme Q-10 100 MG capsule Take 100 mg by mouth daily.   diclofenac Sodium (VOLTAREN) 1 % GEL SMARTSIG:2.5 Gram(s) Topical 4 Times Daily   furosemide (LASIX) 20 MG tablet Take 1 tablet (20 mg total) by mouth every other day.   lisinopril  (ZESTRIL) 20 MG tablet Take 1 tablet (20 mg total) by mouth 2 (two) times daily.   MAGNESIUM-OXIDE 400 (241.3 Mg) MG tablet Take 1 tablet by mouth daily.   metoprolol succinate (TOPROL-XL) 50 MG 24 hr tablet Take 1 tablet (50 mg total) by mouth 2 (two) times daily. Take with or immediately following a meal.   [DISCONTINUED] simvastatin (ZOCOR) 40 MG tablet Take 40 mg by mouth at bedtime.     Allergies:   Prednisone, Penicillins, Sulfa antibiotics, Sulfonamide derivatives, Sulfamethoxazole, and Tramadol   ROS:   Please see the history of present illness.    All other systems reviewed and are negative.  EKGs/Labs/Other Studies Reviewed:    The following studies were reviewed today: Cardiac Studies & Procedures     STRESS TESTS  MYOCARDIAL PERFUSION IMAGING 07/28/2019  Narrative  There was no ST segment deviation noted during stress.  No T wave inversion was noted during stress.  Defect 1: There is a medium defect of mild severity.  Low risk stress nuclear study with a limited area of mild ischemia, corresponding to the distribution of a diagonal or ramus intermedius artery. Non-gated study.   ECHOCARDIOGRAM  ECHOCARDIOGRAM COMPLETE 02/12/2023  Narrative ECHOCARDIOGRAM REPORT    Patient Name:   Jessica Ware Date of Exam: 02/12/2023 Medical Rec #:  [id number]  Height:       63.0 in Accession #:    [id number]             Weight:       147.0 lb Date of Birth:  September 12, 1935              BSA:          1.696 m Patient Age:    87 years               BP:           166/95 mmHg Patient Gender: F                      HR:           64 bpm. Exam Location:  [address]  Procedure: 2D Echo, Cardiac Doppler, Color Doppler and Strain Analysis  Indications:    I07.1 Tricupsid Insufficiency  History:        Patient has prior history of Echocardiogram examinations, most recent 01/17/2022. Heart failure, Arrythmias:PVC, Signs/Symptoms:Dyspnea; Risk Factors:Hypertension  and HLD.  Sonographer:    Clearence Ped RCS Referring Phys: [phone] Jeslin Bazinet  IMPRESSIONS   1. Left ventricular ejection fraction, by estimation, is 60 to 65%. The left ventricle has normal function. The left ventricle has no regional wall motion abnormalities. There is mild left ventricular hypertrophy. Left ventricular diastolic parameters are indeterminate. 2. Right ventricular systolic function is normal. The right ventricular size is normal. There is normal pulmonary artery systolic pressure. 3. The mitral valve is normal in structure. Mild mitral valve regurgitation. 4. The aortic valve is tricuspid. Aortic valve regurgitation is trivial. Aortic valve sclerosis is present, with no evidence of aortic valve stenosis. 5. The inferior vena cava is normal in size with greater than 50% respiratory variability, suggesting right atrial pressure of 3 mmHg.  Comparison(s): The left ventricular function is unchanged.  FINDINGS Left Ventricle: Left ventricular ejection fraction, by estimation, is 60 to 65%. The left ventricle has normal function. The left ventricle has no regional wall motion abnormalities. The global longitudinal strain is normal despite suboptimal segment tracking. The left ventricular internal cavity size was normal in size. There is mild left ventricular hypertrophy. Left ventricular diastolic parameters are indeterminate.  Right Ventricle: The right ventricular size is normal. Right vetricular wall thickness was not assessed. Right ventricular systolic function is normal. There is normal pulmonary artery systolic pressure. The tricuspid regurgitant velocity is 2.66 m/s, and with an assumed right atrial pressure of 3 mmHg, the estimated right ventricular systolic pressure is 31.3 mmHg.  Left Atrium: Left atrial size was normal in size.  Right Atrium: Right atrial size was normal in size.  Pericardium: There is no evidence of pericardial effusion.  Mitral Valve: The  mitral valve is normal in structure. Mild mitral valve regurgitation.  Tricuspid Valve: The tricuspid valve is normal in structure. Tricuspid valve regurgitation is mild.  Aortic Valve: The aortic valve is tricuspid. Aortic valve regurgitation is trivial. Aortic regurgitation PHT measures 544 msec. Aortic valve sclerosis is present, with no evidence of aortic valve stenosis.  Pulmonic Valve: The pulmonic valve was normal in structure. Pulmonic valve regurgitation is trivial.  Aorta: The aortic root and ascending aorta are structurally normal, with no evidence of dilitation.  Venous: The inferior vena cava is normal in size with greater than 50% respiratory variability, suggesting right atrial pressure of 3 mmHg.  IAS/Shunts: No atrial level shunt detected by color flow Doppler.  LEFT VENTRICLE PLAX 2D LVIDd:         3.90 cm   Diastology LVIDs:         2.80 cm   LV e' medial:    5.44 cm/s LV PW:         1.10 cm   LV E/e' medial:  15.5 LV IVS:        1.20 cm   LV e' lateral:   7.51 cm/s LVOT diam:     2.00 cm   LV E/e' lateral: 11.2 LV SV:         67 LV SV Index:   39 LVOT Area:     3.14 cm   RIGHT VENTRICLE RV Basal diam:  2.50 cm RV S prime:     11.50 cm/s TAPSE (M-mode): 2.6 cm RVSP:           31.3 mmHg  LEFT ATRIUM             Index        RIGHT ATRIUM           Index LA diam:        2.80 cm 1.65 cm/m   RA Pressure: 3.00 mmHg LA Vol (A2C):   53.5 ml 31.54 ml/m  RA Area:     9.70 cm LA Vol (A4C):   42.4 ml 24.99 ml/m  RA Volume:   18.70 ml  11.02 ml/m LA Biplane Vol: 49.7 ml 29.30 ml/m AORTIC VALVE LVOT Vmax:   89.40 cm/s LVOT Vmean:  60.300 cm/s LVOT VTI:    0.212 m AI PHT:      544 msec  AORTA Ao Root diam: 3.70 cm Ao Asc diam:  3.40 cm  MITRAL VALVE                TRICUSPID VALVE MV Area (PHT):              TR Peak grad:   28.3 mmHg MV Decel Time:              TR Vmax:        266.00 cm/s MV E velocity: 84.40 cm/s   Estimated RAP:  3.00 mmHg MV A  velocity: 135.00 cm/s  RVSP:           31.3 mmHg MV E/A ratio:  0.63 SHUNTS Systemic VTI:  0.21 m Systemic Diam: 2.00 cm  Dietrich Pates MD Electronically signed by Dietrich Pates MD Signature Date/Time: 02/12/2023/5:10:27 PM    Final    MONITORS  LONG TERM MONITOR (3-14 DAYS) 07/06/2020  Narrative Patch Wear Time:  2 days and 22 hours (2021-12-27T21:44:38-0500 to 2021-12-30T20:35:18-0500)  Patient had a min HR of 76 bpm, max HR of 157 bpm, and avg HR of 92 bpm. Predominant underlying rhythm was Sinus Rhythm. Isolated SVEs were rare (<1.0%), SVE Couplets were rare (<1.0%), and no SVE Triplets were present. Isolated VEs were frequent (28.9%, 110740), VE Couplets were rare (<1.0%, 315), and VE Triplets were rare (<1.0%, 103). Ventricular Bigeminy and Trigeminy were present.  Study reviewed. Average HR 92 bpm. No sustained arrhythmia. No Afib or flutter. Frequent monomorphic PVC's (29% burden).           EKG:        Recent Labs: 11/18/2022: ALT 17; BUN 26; Creatinine, Ser 0.82; Magnesium 2.1; Potassium 4.2; Sodium 140  Recent Lipid Panel    Component Value Date/Time   CHOL 181 11/06/2020 1022   TRIG 99 11/06/2020 1022   HDL 67  11/06/2020 1022   CHOLHDL 2.7 11/06/2020 1022   CHOLHDL 3.8 02/26/2016 1332   VLDL 19 02/26/2016 1332   LDLCALC 96 11/06/2020 1022   LDLDIRECT 112.1 07/03/2011 1046     Risk Assessment/Calculations:                Physical Exam:    VS:  BP 120/60   Pulse 74   Ht 5\' 3"  (1.6 m)   Wt 151 lb (68.5 kg)   SpO2 95%   BMI 26.75 kg/m     Wt Readings from Last 3 Encounters:  05/07/23 151 lb (68.5 kg)  11/18/22 147 lb (66.7 kg)  01/13/22 143 lb 12.8 oz (65.2 kg)     GEN:  Well nourished, well developed in no acute distress HEENT: Normal NECK: No JVD; No carotid bruits LYMPHATICS: No lymphadenopathy CARDIAC: RRR, no murmurs, rubs, gallops RESPIRATORY:  Clear to auscultation without rales, wheezing or rhonchi  ABDOMEN: Soft, non-tender,  non-distended MUSCULOSKELETAL: The right knee is swollen and tender to touch but there is no ankle or pretibial edema in either leg; No deformity  SKIN: Warm and dry NEUROLOGIC:  Alert and oriented x 3 PSYCHIATRIC:  Normal affect   Assessment & Plan Essential hypertension Blood pressure now well-controlled on a combination of lisinopril, low-dose amlodipine, and metoprolol succinate.  I will update her labs with a metabolic panel today.  She will continue her current medical program. Chronic heart failure with preserved ejection fraction (HCC) Appears optimized medically.  Continue furosemide 20 mg every other day. Medication management As above, will continue her current medical program.  I did not make any changes today Tricuspid valve insufficiency, unspecified etiology Mild on recent echocardiogram.  Reviewed this with her and advised that we can follow her clinically moving forward.      Medication Adjustments/Labs and Tests Ordered: Current medicines are reviewed at length with the patient today.  Concerns regarding medicines are outlined above.  Orders Placed This Encounter  Procedures   Comp Met (CMET)   Lipid Profile   Magnesium   No orders of the defined types were placed in this encounter.   Patient Instructions  Medication Instructions:  Your physician recommends that you continue on your current medications as directed. Please refer to the Current Medication list given to you today.  *If you need a refill on your cardiac medications before your next appointment, please call your pharmacy*   Lab Work: TODAY:  CMET, LIPID, & MAG  If you have labs (blood work) drawn today and your tests are completely normal, you will receive your results only by: MyChart Message (if you have MyChart) OR A paper copy in the mail If you have any lab test that is abnormal or we need to change your treatment, we will call you to review the results.   Testing/Procedures: None  ordered   Follow-Up: At [facility], you and your health needs are our priority.  As part of our continuing mission to provide you with exceptional heart care, we have created designated Provider Care Teams.  These Care Teams include your primary Cardiologist (physician) and Advanced Practice Providers (APPs -  Physician Assistants and Nurse Practitioners) who all work together to provide you with the care you need, when you need it.  We recommend signing up for the patient portal called "MyChart".  Sign up information is provided on this After Visit Summary.  MyChart is used to connect with patients for Virtual Visits (Telemedicine).  Patients are able  to view lab/test results, encounter notes, upcoming appointments, etc.  Non-urgent messages can be sent to your provider as well.   To learn more about what you can do with MyChart, go to [email or web address].    Your next appointment:   6 month(s)  Provider:   Tonny Bollman, MD     Other Instructions     Signed, Tonny Bollman, MD  05/07/2023 4:54 PM    [facility]

## 2023-05-08 ENCOUNTER — Telehealth: Payer: Self-pay | Admitting: Cardiovascular Disease

## 2023-05-08 DIAGNOSIS — Z79899 Other long term (current) drug therapy: Secondary | ICD-10-CM

## 2023-05-08 LAB — COMPREHENSIVE METABOLIC PANEL
ALT: 15 [IU]/L (ref 0–32)
AST: 25 [IU]/L (ref 0–40)
Albumin: 4.1 g/dL (ref 3.7–4.7)
Alkaline Phosphatase: 158 [IU]/L — ABNORMAL HIGH (ref 44–121)
BUN/Creatinine Ratio: 19 (ref 12–28)
BUN: 17 mg/dL (ref 8–27)
Bilirubin Total: 0.7 mg/dL (ref 0.0–1.2)
CO2: 25 mmol/L (ref 20–29)
Calcium: 10.2 mg/dL (ref 8.7–10.3)
Chloride: 104 mmol/L (ref 96–106)
Creatinine, Ser: 0.88 mg/dL (ref 0.57–1.00)
Globulin, Total: 2.6 g/dL (ref 1.5–4.5)
Glucose: 106 mg/dL — ABNORMAL HIGH (ref 70–99)
Potassium: 5.1 mmol/L (ref 3.5–5.2)
Sodium: 144 mmol/L (ref 134–144)
Total Protein: 6.7 g/dL (ref 6.0–8.5)
eGFR: 64 mL/min/{1.73_m2} (ref >59)

## 2023-05-08 LAB — LIPID PANEL
Chol/HDL Ratio: 4 ratio (ref 0.0–4.4)
Cholesterol, Total: 236 mg/dL — ABNORMAL HIGH (ref 100–199)
HDL: 59 mg/dL (ref >39)
LDL Chol Calc (NIH): 148 mg/dL — ABNORMAL HIGH (ref 0–99)
Triglycerides: 161 mg/dL — ABNORMAL HIGH (ref 0–149)
VLDL Cholesterol Cal: 29 mg/dL (ref 5–40)

## 2023-05-08 LAB — MAGNESIUM: Magnesium: 2.1 mg/dL (ref 1.6–2.3)

## 2023-05-08 NOTE — Telephone Encounter (Signed)
Pt c/o medication issue:  1. Name of Medication:      2. How are you currently taking this medication (dosage and times per day)?   3. Are you having a reaction (difficulty breathing--STAT)?   4. What is your medication issue? Patient states that she was told in her appt that she would have this med available to pick up today, but says the pharmacy does not currently have this prescription. Requesting call back to get clarification if patient is to be taking this medication. Please advise.

## 2023-05-11 MED ORDER — SIMVASTATIN 20 MG PO TABS: 20.0000 mg | ORAL_TABLET | Freq: Every day | ORAL | 3 refills | Status: DC

## 2023-05-11 NOTE — Telephone Encounter (Signed)
Tonny Bollman, MD 05/11/2023  7:55 AM EST Back to Top    Electrolytes look good.  Renal function and liver function are normal.  Cholesterol is elevated as simvastatin has been discontinued.  Would be reasonable to go back on simvastatin 20 mg daily if patient is agreeable.    Medication sent to pharmacy and pt made aware. She is willing to restart at 20mg  daily and requests rx go to [facility] pharmacy. Repeat labs entered and released so that pt can have drawn in [facility].

## 2023-06-05 ENCOUNTER — Ambulatory Visit: Payer: Medicare Other | Admitting: Cardiovascular Disease

## 2023-06-12 ENCOUNTER — Ambulatory Visit: Payer: Medicare Other | Admitting: Cardiovascular Disease

## 2023-06-16 ENCOUNTER — Other Ambulatory Visit: Payer: Self-pay | Admitting: Cardiovascular Disease

## 2023-09-04 ENCOUNTER — Other Ambulatory Visit: Payer: Self-pay | Admitting: Cardiovascular Disease

## 2023-09-16 ENCOUNTER — Other Ambulatory Visit: Payer: Self-pay | Admitting: Cardiovascular Disease

## 2023-09-17 LAB — LIPID PANEL
Chol/HDL Ratio: 2.6 ratio (ref 0.0–4.4)
Cholesterol, Total: 162 mg/dL (ref 100–199)
HDL: 62 mg/dL (ref >39)
LDL Chol Calc (NIH): 82 mg/dL (ref 0–99)
Triglycerides: 97 mg/dL (ref 0–149)
VLDL Cholesterol Cal: 18 mg/dL (ref 5–40)

## 2023-09-17 LAB — ALT: ALT: 11 IU/L (ref 0–32)

## 2023-11-12 ENCOUNTER — Ambulatory Visit: Admitting: Cardiovascular Disease

## 2023-12-04 ENCOUNTER — Other Ambulatory Visit: Payer: Self-pay | Admitting: Cardiovascular Disease

## 2023-12-24 ENCOUNTER — Other Ambulatory Visit: Payer: Self-pay | Admitting: Cardiovascular Disease

## 2023-12-24 ENCOUNTER — Ambulatory Visit: Admitting: Cardiovascular Disease

## 2024-02-24 ENCOUNTER — Ambulatory Visit: Admitting: Physician Assistant

## 2024-02-24 NOTE — Progress Notes (Deleted)
 02/24/2024 Jessica Ware [id number] 10/06/1935  Referring provider: Dorena Fernando HERO, MD Primary GI doctor: Dr. Charlanne ( Dr. Debrah)  ASSESSMENT AND PLAN:  Melena with history of GERD and esophageal stenosis status post dilation 2013 EGD 11/2011 With Dr. Debrah for dysphagia showed stricture at GE junction status post dilation 18 mm, 2 cm HH otherwise unremarkable  Post procedural tongue swelling and shortness of breath After EGD with dilation June 2013 patient called back experiencing some minor tongue swelling and shortness of breath with elevated blood pressure. Per Dr. Shella note thought this was likely more anxiety driven however discussed patient would have conscious sedation rather than propofol   diastolic heart failure  02/12/2023 echocardiogram EF 60 to 65% mild MR, trivial AR no aortic  Patient Care Team: Dorena Fernando HERO, MD as PCP - General (Internal Medicine) Wonda Sharper, MD as PCP - Cardiology (Cardiology) Cindie Ole DASEN, MD as PCP - Electrophysiology (Cardiology)  HISTORY OF PRESENT ILLNESS: 88 y.o. female with a past medical history listed below presents for evaluation of ***.   *** Discussed the use of AI scribe software for clinical note transcription with the patient, who gave verbal consent to proceed.  History of Present Illness            She  reports that she has never smoked. She has never used smokeless tobacco. She reports that she does not drink alcohol and does not use drugs.  RELEVANT GI HISTORY, IMAGING AND LABS: Results          CBC    Component Value Date/Time   WBC 7.0 11/06/2020 1022   WBC 8.0 07/17/2019 2045   RBC 4.47 11/06/2020 1022   RBC 3.97 07/17/2019 2045   HGB 13.6 11/06/2020 1022   HCT 41.2 11/06/2020 1022   PLT 233 11/06/2020 1022   MCV 92 11/06/2020 1022   MCH 30.4 11/06/2020 1022   MCH 29.7 07/17/2019 2045   MCHC 33.0 11/06/2020 1022   MCHC 31.8 07/17/2019 2045   RDW 15.0 11/06/2020 1022    LYMPHSABS 1.6 11/06/2020 1022   MONOABS 0.9 07/17/2019 2045   EOSABS 0.1 11/06/2020 1022   BASOSABS 0.1 11/06/2020 1022   No results for input(s): HGB in the last 8760 hours.  CMP     Component Value Date/Time   NA 144 05/07/2023 1506   K 5.1 05/07/2023 1506   CL 104 05/07/2023 1506   CO2 25 05/07/2023 1506   GLUCOSE 106 (H) 05/07/2023 1506   GLUCOSE 108 (H) 07/18/2019 0523   BUN 17 05/07/2023 1506   CREATININE 0.88 05/07/2023 1506   CREATININE 0.82 02/26/2016 1332   CALCIUM 10.2 05/07/2023 1506   PROT 6.7 05/07/2023 1506   ALBUMIN 4.1 05/07/2023 1506   AST 25 05/07/2023 1506   ALT 11 09/16/2023 1414   ALKPHOS 158 (H) 05/07/2023 1506   BILITOT 0.7 05/07/2023 1506   GFRNONAA 53 (L) 07/20/2019 1343   GFRAA 61 07/20/2019 1343      Latest Ref Rng & Units 09/16/2023    2:14 PM 05/07/2023    3:06 PM 11/18/2022    3:23 PM  Hepatic Function  Total Protein 6.0 - 8.5 g/dL  6.7  6.5   Albumin 3.7 - 4.7 g/dL  4.1  4.0   AST 0 - 40 IU/L  25  20   ALT 0 - 32 IU/L 11  15  17    Alk Phosphatase 44 - 121 IU/L  158  128  Total Bilirubin 0.0 - 1.2 mg/dL  0.7  1.3       Current Medications:    Current Outpatient Medications (Cardiovascular):    amLODipine  (NORVASC ) 2.5 MG tablet, Take 1 tablet (2.5 mg total) by mouth 2 (two) times daily.   furosemide  (LASIX ) 20 MG tablet, Take 1 tablet (20 mg total) by mouth daily as needed.   lisinopril  (ZESTRIL ) 20 MG tablet, Take 1 tablet (20 mg total) by mouth 2 (two) times daily.   metoprolol  succinate (TOPROL -XL) 50 MG 24 hr tablet, Take 1 tablet (50 mg total) by mouth 2 (two) times daily. Take with or immediately following a meal.   simvastatin  (ZOCOR ) 20 MG tablet, Take 1 tablet (20 mg total) by mouth at bedtime.   Current Outpatient Medications (Analgesics):    acetaminophen  (TYLENOL ) 500 MG tablet, Take 1,000 mg by mouth every 8 (eight) hours as needed (pain).    aspirin  81 MG tablet, Take 81 mg by mouth daily.   Current Outpatient  Medications (Other):    Cholecalciferol (VITAMIN D3) 50 MCG (2000 UT) capsule, Take 2,000 Units by mouth daily.   Coenzyme Q-10 100 MG capsule, Take 100 mg by mouth daily.   diclofenac Sodium (VOLTAREN) 1 % GEL, SMARTSIG:2.5 Gram(s) Topical 4 Times Daily   MAGNESIUM-OXIDE 400 (241.3 Mg) MG tablet, Take 1 tablet by mouth daily.  Medical History:  Past Medical History:  Diagnosis Date   Allergic rhinitis    Anxiety    Depression    pt denies   GERD (gastroesophageal reflux disease)    History of renal stone    Hyperlipidemia    Hypertension    Obesity    Status post dilation of esophageal narrowing    Allergies:  Allergies  Allergen Reactions   Prednisone Shortness Of Breath   Penicillins Rash    Did it involve swelling of the face/tongue/throat, SOB, or low BP? No Did it involve sudden or severe rash/hives, skin peeling, or any reaction on the inside of your mouth or nose? No Did you need to seek medical attention at a hospital or doctor's office? Yes When did it last happen?    2016   If all above answers are "NO", may proceed with cephalosporin use.    Sulfa Antibiotics Nausea Only   Sulfonamide Derivatives Nausea Only   Sulfamethoxazole Other (See Comments)    unknown   Tramadol Nausea And Vomiting     Surgical History:  She  has a past surgical history that includes Abdominal hysterectomy and Kidney stone surgery. Family History:  Her family history includes Heart disease in her father; Heart disease (age of onset: 74) in her mother; Hypertension in her mother; Kidney disease in her father; Prostate cancer in her father.  REVIEW OF SYSTEMS  : All other systems reviewed and negative except where noted in the History of Present Illness.  PHYSICAL EXAM: There were no vitals taken for this visit. Physical Exam          Alan JONELLE Coombs, PA-C 7:38 AM

## 2024-03-02 ENCOUNTER — Ambulatory Visit: Admitting: Physician Assistant

## 2024-03-07 ENCOUNTER — Other Ambulatory Visit: Payer: Self-pay | Admitting: Cardiovascular Disease

## 2024-03-18 ENCOUNTER — Ambulatory Visit: Attending: Cardiovascular Disease | Admitting: Cardiovascular Disease

## 2024-03-18 ENCOUNTER — Encounter: Payer: Self-pay | Admitting: Cardiovascular Disease

## 2024-03-18 VITALS — BP 130/80 | HR 66 | Ht 63.0 in | Wt 148.4 lb

## 2024-03-18 DIAGNOSIS — E782 Mixed hyperlipidemia: Secondary | ICD-10-CM | POA: Diagnosis not present

## 2024-03-18 DIAGNOSIS — I10 Essential (primary) hypertension: Secondary | ICD-10-CM

## 2024-03-18 DIAGNOSIS — I5032 Chronic diastolic (congestive) heart failure: Secondary | ICD-10-CM | POA: Diagnosis not present

## 2024-03-18 DIAGNOSIS — I071 Rheumatic tricuspid insufficiency: Secondary | ICD-10-CM | POA: Diagnosis not present

## 2024-03-18 NOTE — Assessment & Plan Note (Addendum)
 BP well-controlled on amlodipine , metoprolol  succinate, and lisinopril .  Check metabolic panel.

## 2024-03-18 NOTE — Assessment & Plan Note (Signed)
 Treated with simvastatin .  Last lipids with a cholesterol of 162, HDL 62, LDL 82.  Patient with no personal history of CAD.

## 2024-03-18 NOTE — Patient Instructions (Signed)
 Medication Instructions:  No medication changes were made at this visit. Continue current regimen.   *If you need a refill on your cardiac medications before your next appointment, please call your pharmacy*  Lab Work: To be completed today: BMP, magnesium  If you have labs (blood work) drawn today and your tests are completely normal, you will receive your results only by: MyChart Message (if you have MyChart) OR A paper copy in the mail If you have any lab test that is abnormal or we need to change your treatment, we will call you to review the results.  Testing/Procedures: None ordered today.  Follow-Up: At [facility], you and your health needs are our priority.  As part of our continuing mission to provide you with exceptional heart care, our providers are all part of one team.  This team includes your primary Cardiologist (physician) and Advanced Practice Providers or APPs (Physician Assistants and Nurse Practitioners) who all work together to provide you with the care you need, when you need it.  Your next appointment:   1 year(s)  Provider:   Ozell Fell, MD

## 2024-03-18 NOTE — Progress Notes (Signed)
 Cardiology Office Note:    Date:  03/18/2024   ID:  Jessica Ware, DOB 03-17-1936, MRN [id number]  PCP:  Dorena Fernando HERO, MD    [facility] Providers Cardiologist:  Ozell Fell, MD Electrophysiologist:  OLE ONEIDA HOLTS, MD     Referring MD: Dorena Fernando HERO, MD   Chief Complaint  Patient presents with   Hypertension    History of Present Illness:    Jessica Ware is a 88 y.o. female with a hx of hypertension and HFpEF, presenting for follow-up evaluation.  I saw her last in November 2024.  She is here with her husband today. She has some concerns about her kidney function but we can't find any recent lab results over the past 6 months. She reports a rare palpitation when lying down.  Overall doing pretty well.  Denies chest pain, chest pressure, shortness of breath, or leg swelling.  Reports no recent change in her medications.  Current Medications: Current Meds  Medication Sig   acetaminophen  (TYLENOL ) 500 MG tablet Take 1,000 mg by mouth every 8 (eight) hours as needed (pain).    amLODipine  (NORVASC ) 2.5 MG tablet Take 1 tablet (2.5 mg total) by mouth 2 (two) times daily.   aspirin  81 MG tablet Take 81 mg by mouth daily.   Cholecalciferol (VITAMIN D3) 50 MCG (2000 UT) capsule Take 2,000 Units by mouth daily.   Coenzyme Q-10 100 MG capsule Take 100 mg by mouth daily.   diclofenac Sodium (VOLTAREN) 1 % GEL SMARTSIG:2.5 Gram(s) Topical 4 Times Daily   furosemide  (LASIX ) 20 MG tablet Take 1 tablet (20 mg total) by mouth daily as needed.   lisinopril  (ZESTRIL ) 20 MG tablet Take 1 tablet (20 mg total) by mouth 2 (two) times daily.   MAGNESIUM-OXIDE 400 (241.3 Mg) MG tablet Take 1 tablet by mouth daily.   metoprolol  succinate (TOPROL -XL) 50 MG 24 hr tablet Take 1 tablet (50 mg total) by mouth 2 (two) times daily. Take with or immediately following a meal.   simvastatin  (ZOCOR ) 20 MG tablet Take 1 tablet (20 mg total) by mouth at bedtime.      Allergies:   Prednisone, Penicillins, Sulfa antibiotics, Sulfonamide derivatives, Sulfamethoxazole, and Tramadol   ROS:   Please see the history of present illness.    All other systems reviewed and are negative.  EKGs/Labs/Other Studies Reviewed:    The following studies were reviewed today: Cardiac Studies & Procedures   ______________________________________________________________________________________________   STRESS TESTS  MYOCARDIAL PERFUSION IMAGING 07/28/2019  Interpretation Summary  There was no ST segment deviation noted during stress.  No T wave inversion was noted during stress.  Defect 1: There is a medium defect of mild severity.  Low risk stress nuclear study with a limited area of mild ischemia, corresponding to the distribution of a diagonal or ramus intermedius artery. Non-gated study.   ECHOCARDIOGRAM  ECHOCARDIOGRAM COMPLETE 02/12/2023  Narrative ECHOCARDIOGRAM REPORT    Patient Name:   Jessica Ware Date of Exam: 02/12/2023 Medical Rec #:  [id number]              Height:       63.0 in Accession #:    [id number]             Weight:       147.0 lb Date of Birth:  11-16-1935              BSA:          1.696 m Patient  Age:    87 years               BP:           166/95 mmHg Patient Gender: F                      HR:           64 bpm. Exam Location:  [address]  Procedure: 2D Echo, Cardiac Doppler, Color Doppler and Strain Analysis  Indications:    I07.1 Tricupsid Insufficiency  History:        Patient has prior history of Echocardiogram examinations, most recent 01/17/2022. Heart failure, Arrythmias:PVC, Signs/Symptoms:Dyspnea; Risk Factors:Hypertension and HLD.  Sonographer:    Waldo Guadalajara RCS Referring Phys: [phone] Remmi Armenteros  IMPRESSIONS   1. Left ventricular ejection fraction, by estimation, is 60 to 65%. The left ventricle has normal function. The left ventricle has no regional wall motion abnormalities. There is mild  left ventricular hypertrophy. Left ventricular diastolic parameters are indeterminate. 2. Right ventricular systolic function is normal. The right ventricular size is normal. There is normal pulmonary artery systolic pressure. 3. The mitral valve is normal in structure. Mild mitral valve regurgitation. 4. The aortic valve is tricuspid. Aortic valve regurgitation is trivial. Aortic valve sclerosis is present, with no evidence of aortic valve stenosis. 5. The inferior vena cava is normal in size with greater than 50% respiratory variability, suggesting right atrial pressure of 3 mmHg.  Comparison(s): The left ventricular function is unchanged.  FINDINGS Left Ventricle: Left ventricular ejection fraction, by estimation, is 60 to 65%. The left ventricle has normal function. The left ventricle has no regional wall motion abnormalities. The global longitudinal strain is normal despite suboptimal segment tracking. The left ventricular internal cavity size was normal in size. There is mild left ventricular hypertrophy. Left ventricular diastolic parameters are indeterminate.  Right Ventricle: The right ventricular size is normal. Right vetricular wall thickness was not assessed. Right ventricular systolic function is normal. There is normal pulmonary artery systolic pressure. The tricuspid regurgitant velocity is 2.66 m/s, and with an assumed right atrial pressure of 3 mmHg, the estimated right ventricular systolic pressure is 31.3 mmHg.  Left Atrium: Left atrial size was normal in size.  Right Atrium: Right atrial size was normal in size.  Pericardium: There is no evidence of pericardial effusion.  Mitral Valve: The mitral valve is normal in structure. Mild mitral valve regurgitation.  Tricuspid Valve: The tricuspid valve is normal in structure. Tricuspid valve regurgitation is mild.  Aortic Valve: The aortic valve is tricuspid. Aortic valve regurgitation is trivial. Aortic regurgitation PHT  measures 544 msec. Aortic valve sclerosis is present, with no evidence of aortic valve stenosis.  Pulmonic Valve: The pulmonic valve was normal in structure. Pulmonic valve regurgitation is trivial.  Aorta: The aortic root and ascending aorta are structurally normal, with no evidence of dilitation.  Venous: The inferior vena cava is normal in size with greater than 50% respiratory variability, suggesting right atrial pressure of 3 mmHg.  IAS/Shunts: No atrial level shunt detected by color flow Doppler.   LEFT VENTRICLE PLAX 2D LVIDd:         3.90 cm   Diastology LVIDs:         2.80 cm   LV e' medial:    5.44 cm/s LV PW:         1.10 cm   LV E/e' medial:  15.5 LV IVS:  1.20 cm   LV e' lateral:   7.51 cm/s LVOT diam:     2.00 cm   LV E/e' lateral: 11.2 LV SV:         67 LV SV Index:   39 LVOT Area:     3.14 cm   RIGHT VENTRICLE RV Basal diam:  2.50 cm RV S prime:     11.50 cm/s TAPSE (M-mode): 2.6 cm RVSP:           31.3 mmHg  LEFT ATRIUM             Index        RIGHT ATRIUM           Index LA diam:        2.80 cm 1.65 cm/m   RA Pressure: 3.00 mmHg LA Vol (A2C):   53.5 ml 31.54 ml/m  RA Area:     9.70 cm LA Vol (A4C):   42.4 ml 24.99 ml/m  RA Volume:   18.70 ml  11.02 ml/m LA Biplane Vol: 49.7 ml 29.30 ml/m AORTIC VALVE LVOT Vmax:   89.40 cm/s LVOT Vmean:  60.300 cm/s LVOT VTI:    0.212 m AI PHT:      544 msec  AORTA Ao Root diam: 3.70 cm Ao Asc diam:  3.40 cm  MITRAL VALVE                TRICUSPID VALVE MV Area (PHT):              TR Peak grad:   28.3 mmHg MV Decel Time:              TR Vmax:        266.00 cm/s MV E velocity: 84.40 cm/s   Estimated RAP:  3.00 mmHg MV A velocity: 135.00 cm/s  RVSP:           31.3 mmHg MV E/A ratio:  0.63 SHUNTS Systemic VTI:  0.21 m Systemic Diam: 2.00 cm  Vina Gull MD Electronically signed by Vina Gull MD Signature Date/Time: 02/12/2023/5:10:27 PM    Final    MONITORS  LONG TERM MONITOR (3-14 DAYS)  07/06/2020  Narrative Patch Wear Time:  2 days and 22 hours (2021-12-27T21:44:38-0500 to 2021-12-30T20:35:18-0500)  Patient had a min HR of 76 bpm, max HR of 157 bpm, and avg HR of 92 bpm. Predominant underlying rhythm was Sinus Rhythm. Isolated SVEs were rare (<1.0%), SVE Couplets were rare (<1.0%), and no SVE Triplets were present. Isolated VEs were frequent (28.9%, 110740), VE Couplets were rare (<1.0%, 315), and VE Triplets were rare (<1.0%, 103). Ventricular Bigeminy and Trigeminy were present.  Study reviewed. Average HR 92 bpm. No sustained arrhythmia. No Afib or flutter. Frequent monomorphic PVC's (29% burden).       ______________________________________________________________________________________________      EKG:   EKG Interpretation Date/Time:  Friday March 18 2024 13:46:27 EDT Ventricular Rate:  66 PR Interval:  174 QRS Duration:  94 QT Interval:  376 QTC Calculation: 394 R Axis:   62  Text Interpretation: Normal sinus rhythm Minimal voltage criteria for LVH, may be normal variant ( Sokolow-Lyon ) When compared with ECG of 17-Jul-2019 20:31, PREVIOUS ECG IS PRESENT Confirmed by Wonda Sharper [phone]) on 03/18/2024 2:04:04 PM    Recent Labs: 05/07/2023: BUN 17; Creatinine, Ser 0.88; Magnesium 2.1; Potassium 5.1; Sodium 144 09/16/2023: ALT 11  Recent Lipid Panel    Component Value Date/Time   CHOL 162 09/16/2023 1414   TRIG 97  09/16/2023 1414   HDL 62 09/16/2023 1414   CHOLHDL 2.6 09/16/2023 1414   CHOLHDL 3.8 02/26/2016 1332   VLDL 19 02/26/2016 1332   LDLCALC 82 09/16/2023 1414   LDLDIRECT 112.1 07/03/2011 1046     Risk Assessment/Calculations:                Physical Exam:    VS:  BP 130/80   Pulse 66   Ht 5' 3 (1.6 m)   Wt 148 lb 6.4 oz (67.3 kg)   SpO2 99%   BMI 26.29 kg/m     Wt Readings from Last 3 Encounters:  03/18/24 148 lb 6.4 oz (67.3 kg)  05/07/23 151 lb (68.5 kg)  11/18/22 147 lb (66.7 kg)     GEN:  Well nourished, well  developed in no acute distress HEENT: Normal NECK: No JVD; No carotid bruits LYMPHATICS: No lymphadenopathy CARDIAC: RRR, no murmurs, rubs, gallops RESPIRATORY:  Clear to auscultation without rales, wheezing or rhonchi  ABDOMEN: Soft, non-tender, non-distended MUSCULOSKELETAL:  No edema; No deformity  SKIN: Warm and dry NEUROLOGIC:  Alert and oriented x 3 PSYCHIATRIC:  Normal affect   Assessment & Plan Essential hypertension BP well-controlled on amlodipine , metoprolol  succinate, and lisinopril .  Check metabolic panel. Chronic heart failure with preserved ejection fraction (HCC) Most recent echo from 2024 reviewed demonstrating LVEF 60 to 65% with mild LVH and indeterminate LV diastolic parameters, normal RV size and function, and mild mitral and tricuspid regurgitation.  Appears clinically stable with no functional limitation at this time. Tricuspid valve insufficiency, unspecified etiology Mild tricuspid regurgitation noted at the time of her most recent echocardiogram.  Clinical follow-up planned. Mixed hyperlipidemia Treated with simvastatin .  Last lipids with a cholesterol of 162, HDL 62, LDL 82.  Patient with no personal history of CAD.            Medication Adjustments/Labs and Tests Ordered: Current medicines are reviewed at length with the patient today.  Concerns regarding medicines are outlined above.  Orders Placed This Encounter  Procedures   EKG 12-Lead   No orders of the defined types were placed in this encounter.   There are no Patient Instructions on file for this visit.   Signed, Ozell Fell, MD  03/18/2024 2:32 PM     [facility]

## 2024-04-05 ENCOUNTER — Other Ambulatory Visit: Payer: Self-pay | Admitting: Cardiovascular Disease

## 2024-04-08 ENCOUNTER — Ambulatory Visit: Admitting: Cardiovascular Disease

## 2024-06-07 ENCOUNTER — Other Ambulatory Visit: Payer: Self-pay | Admitting: Cardiovascular Disease
